# Patient Record
Sex: Female | Born: 1985 | Hispanic: Yes | Marital: Single | Smoking: Never smoker
Health system: Southern US, Community
[De-identification: ages and names within clinical notes are randomized; demographics above are authoritative.]

## PROBLEM LIST (undated history)

## (undated) DIAGNOSIS — L509 Urticaria, unspecified: Secondary | ICD-10-CM

## (undated) DIAGNOSIS — R87629 Unspecified abnormal cytological findings in specimens from vagina: Secondary | ICD-10-CM

## (undated) DIAGNOSIS — Z789 Other specified health status: Secondary | ICD-10-CM

## (undated) HISTORY — PX: NO PAST SURGERIES: SHX2092

## (undated) HISTORY — DX: Unspecified abnormal cytological findings in specimens from vagina: R87.629

## (undated) HISTORY — DX: Other specified health status: Z78.9

---

## 2008-06-08 ENCOUNTER — Inpatient Hospital Stay (HOSPITAL_COMMUNITY): Admission: AD | Admit: 2008-06-08 | Discharge: 2008-06-08 | Payer: Self-pay | Admitting: Obstetrics & Gynecology

## 2008-08-27 ENCOUNTER — Ambulatory Visit: Payer: Self-pay | Admitting: Obstetrics & Gynecology

## 2008-08-27 ENCOUNTER — Inpatient Hospital Stay (HOSPITAL_COMMUNITY): Admission: AD | Admit: 2008-08-27 | Discharge: 2008-08-27 | Payer: Self-pay | Admitting: Obstetrics & Gynecology

## 2008-09-02 ENCOUNTER — Inpatient Hospital Stay (HOSPITAL_COMMUNITY): Admission: AD | Admit: 2008-09-02 | Discharge: 2008-09-07 | Payer: Self-pay | Admitting: Obstetrics

## 2008-09-02 ENCOUNTER — Ambulatory Visit: Payer: Self-pay | Admitting: Obstetrics & Gynecology

## 2009-09-07 ENCOUNTER — Emergency Department (HOSPITAL_COMMUNITY): Admission: EM | Admit: 2009-09-07 | Discharge: 2009-09-07 | Payer: Self-pay | Admitting: Emergency Medicine

## 2009-11-04 ENCOUNTER — Emergency Department (HOSPITAL_COMMUNITY): Admission: EM | Admit: 2009-11-04 | Discharge: 2009-11-04 | Payer: Self-pay | Admitting: Emergency Medicine

## 2010-02-25 ENCOUNTER — Emergency Department (HOSPITAL_COMMUNITY): Admission: EM | Admit: 2010-02-25 | Discharge: 2010-02-25 | Payer: Self-pay | Admitting: Emergency Medicine

## 2010-04-20 ENCOUNTER — Emergency Department (HOSPITAL_COMMUNITY): Admission: EM | Admit: 2010-04-20 | Discharge: 2010-04-20 | Payer: Self-pay | Admitting: Emergency Medicine

## 2010-05-29 ENCOUNTER — Emergency Department (HOSPITAL_COMMUNITY): Admission: EM | Admit: 2010-05-29 | Discharge: 2010-05-30 | Payer: Self-pay | Admitting: Emergency Medicine

## 2010-08-08 ENCOUNTER — Emergency Department (HOSPITAL_COMMUNITY): Admission: EM | Admit: 2010-08-08 | Discharge: 2010-08-09 | Payer: Self-pay | Admitting: Emergency Medicine

## 2010-11-29 ENCOUNTER — Emergency Department (HOSPITAL_COMMUNITY)
Admission: EM | Admit: 2010-11-29 | Discharge: 2010-11-29 | Payer: Self-pay | Source: Home / Self Care | Admitting: Emergency Medicine

## 2010-11-29 LAB — URINALYSIS, ROUTINE W REFLEX MICROSCOPIC
Bilirubin Urine: NEGATIVE
Ketones, ur: 15 mg/dL — AB
Nitrite: NEGATIVE
Protein, ur: NEGATIVE mg/dL
Specific Gravity, Urine: 1.025 (ref 1.005–1.030)
Urine Glucose, Fasting: NEGATIVE mg/dL
Urobilinogen, UA: 1 mg/dL (ref 0.0–1.0)
pH: 6.5 (ref 5.0–8.0)

## 2010-11-29 LAB — WET PREP, GENITAL
Trich, Wet Prep: NONE SEEN
Yeast Wet Prep HPF POC: NONE SEEN

## 2010-11-29 LAB — URINE MICROSCOPIC-ADD ON

## 2010-11-29 LAB — PREGNANCY, URINE: Preg Test, Ur: NEGATIVE

## 2010-12-01 LAB — GC/CHLAMYDIA PROBE AMP, GENITAL
Chlamydia, DNA Probe: NEGATIVE
GC Probe Amp, Genital: NEGATIVE

## 2011-02-08 LAB — WET PREP, GENITAL
Trich, Wet Prep: NONE SEEN
Yeast Wet Prep HPF POC: NONE SEEN

## 2011-02-08 LAB — PREGNANCY, URINE: Preg Test, Ur: NEGATIVE

## 2011-02-08 LAB — URINALYSIS, ROUTINE W REFLEX MICROSCOPIC
Bilirubin Urine: NEGATIVE
Glucose, UA: NEGATIVE mg/dL
Ketones, ur: NEGATIVE mg/dL
Nitrite: NEGATIVE
Protein, ur: NEGATIVE mg/dL
Specific Gravity, Urine: >1.03 — ABNORMAL HIGH (ref 1.005–1.030)
Urobilinogen, UA: 0.2 mg/dL (ref 0.0–1.0)
pH: 6 (ref 5.0–8.0)

## 2011-02-08 LAB — GC/CHLAMYDIA PROBE AMP, GENITAL
Chlamydia, DNA Probe: NEGATIVE
GC Probe Amp, Genital: NEGATIVE

## 2011-02-08 LAB — URINE MICROSCOPIC-ADD ON

## 2011-02-27 LAB — CBC
HCT: 40.2 % (ref 36.0–46.0)
Hemoglobin: 13.7 g/dL (ref 12.0–15.0)
MCHC: 34.1 g/dL (ref 30.0–36.0)
MCV: 90.8 fL (ref 78.0–100.0)
Platelets: 353 10*3/uL (ref 150–400)
RBC: 4.42 MIL/uL (ref 3.87–5.11)
RDW: 12.6 % (ref 11.5–15.5)
WBC: 8.4 10*3/uL (ref 4.0–10.5)

## 2011-02-27 LAB — DIFFERENTIAL
Basophils Absolute: 0.1 10*3/uL (ref 0.0–0.1)
Basophils Relative: 2 % — ABNORMAL HIGH (ref 0–1)
Eosinophils Absolute: 0 10*3/uL (ref 0.0–0.7)
Eosinophils Relative: 0 % (ref 0–5)
Lymphocytes Relative: 24 % (ref 12–46)
Lymphs Abs: 2 10*3/uL (ref 0.7–4.0)
Monocytes Absolute: 0.5 10*3/uL (ref 0.1–1.0)
Monocytes Relative: 6 % (ref 3–12)
Neutro Abs: 5.7 10*3/uL (ref 1.7–7.7)
Neutrophils Relative %: 68 % (ref 43–77)

## 2011-02-27 LAB — BASIC METABOLIC PANEL
BUN: 5 mg/dL — ABNORMAL LOW (ref 6–23)
CO2: 24 mEq/L (ref 19–32)
Calcium: 8.8 mg/dL (ref 8.4–10.5)
Chloride: 104 mEq/L (ref 96–112)
Creatinine, Ser: 0.44 mg/dL (ref 0.4–1.2)
GFR calc Af Amer: >60 mL/min (ref >60)
GFR calc non Af Amer: >60 mL/min (ref >60)
Glucose, Bld: 89 mg/dL (ref 70–99)
Potassium: 3.6 mEq/L (ref 3.5–5.1)
Sodium: 134 mEq/L — ABNORMAL LOW (ref 135–145)

## 2011-02-27 LAB — URINALYSIS, ROUTINE W REFLEX MICROSCOPIC
Bilirubin Urine: NEGATIVE
Glucose, UA: NEGATIVE mg/dL
Hgb urine dipstick: NEGATIVE
Ketones, ur: NEGATIVE mg/dL
Nitrite: NEGATIVE
Protein, ur: NEGATIVE mg/dL
Specific Gravity, Urine: 1.009 (ref 1.005–1.030)
Urobilinogen, UA: 0.2 mg/dL (ref 0.0–1.0)
pH: 8 (ref 5.0–8.0)

## 2011-02-27 LAB — URINE MICROSCOPIC-ADD ON

## 2011-02-28 ENCOUNTER — Emergency Department (HOSPITAL_COMMUNITY)
Admission: EM | Admit: 2011-02-28 | Discharge: 2011-02-28 | Disposition: A | Discharge status: Home / Self Care | Payer: Medicaid Other | Attending: Emergency Medicine | Admitting: Emergency Medicine

## 2011-02-28 DIAGNOSIS — H6500 Acute serous otitis media, unspecified ear: Secondary | ICD-10-CM | POA: Insufficient documentation

## 2011-02-28 DIAGNOSIS — H9209 Otalgia, unspecified ear: Secondary | ICD-10-CM | POA: Insufficient documentation

## 2011-04-10 NOTE — Op Note (Signed)
Sylvia Day, Sylvia Day            ACCOUNT NO.:  [id number]   MEDICAL RECORD NO.:  [id number]           PATIENT TYPE:   LOCATION:                                 FACILITY:   PHYSICIAN:  Allie Bossier, MD        DATE OF BIRTH:  Feb 27, 1986   DATE OF PROCEDURE:  DATE OF DISCHARGE:                               OPERATIVE REPORT   PROCEDURE:  Repair of fourth-degree laceration and vaginal tears.   SURGEON:  Myra C. Marice Potter, MD   ANESTHESIA:  Local plus 2 mg of IV Stadol.   PROCEDURE:  I was called to evaluate with a vaginal laceration after a  normal spontaneous vaginal delivery on this pre-eclamptic patient who is  being treated with mag and induced.  The delivery was apparently  uncomplicated and bleeding from the vagina was already noted before  delivery of the head.  After the baby was delivered, a fourth-degree  tear was noted as well as vaginal sulcus wall tears bilaterally and  labial tears bilaterally.  On exam, the vulva was grossly swollen and  edematous.  There certainly a fourth-degree tear noted.  The rectal  mucosa was involved; however, the entire sphincter was not involved.  There was a 5-cm vaginal sulcus tear on the patient's left and a vaginal  tear from the hymen up through the labia majora bilaterally.  I  initially did a complete inspection, and the patient's bladder was  emptied for 500 mL.  Foley catheter was left in place.  A 3-0 Vicryl  sutures used in this case unless otherwise specified.  I began to repair  the rectal mucosa and brought that out to the peritoneum.  The sutures  were left and tagged and held for later use.  The sphincter was grasped  with Allis clamps and repaired in a four-point interrupted closure.  I  then began with the left vaginal sulcus tear which required the use of a  vaginal retractor.  The cervix was noted to be intact with no  lacerations.  The sulcus tear was repaired in a running locking fashion  with 3-0 Vicryl.  Excellent  hemostasis was noted at the completion of  this.  The vaginal labial tears were then repaired with a subcuticular  running nonlocking 3-0 Vicryl suture.  The remainder of the laceration  then resembled a second-degree midline episiotomy which was repaired in  usual fashion.  At the end of the case, excellent cosmetic results were  obtained.  A rectovaginal exam was done.  No defects were palpable.  She  tolerated the procedure well with approximately 20 mL of 1% lidocaine  and 2 mg of IV Stadol.      Allie Bossier, MD  Electronically Signed     MCD/MEDQ  D:  09/03/2008  T:  09/04/2008  Job:  [id number]

## 2011-04-10 NOTE — Discharge Summary (Signed)
[name], Sylvia Day            ACCOUNT NO.:  [id number]   MEDICAL RECORD NO.:  [id number]          PATIENT TYPE:  INP   LOCATION:  9116                          FACILITY:  [facility]   PHYSICIAN:  Lesly Dukes, M.D. DATE OF BIRTH:  13-Apr-1986   DATE OF ADMISSION:  09/02/2008  DATE OF DISCHARGE:  09/07/2008                               DISCHARGE SUMMARY   REASON FOR ADMISSION:  Induction of labor.   PROCEDURES PRENATAL:  Nonstress test.   PROCEDURES INTRAPARTUM:  Spontaneous vaginal delivery.   PROCEDURES POSTPARTUM:  Antibiotics.   COMPLICATIONS OPERATIVE AND POSTPARTUM:  1. First-degree perineal laceration.  2. Second-degree bilateral sulcus laceration.   DISCHARGE DIAGNOSES:  1. Term pregnancy, delivered.  2. Mild preeclampsia.  3. Anemia.   DISCHARGE INFORMATION:   ACTIVITY:  Pelvic rest x6 weeks.   DIET:  Routine.   MEDICATIONS:  1. Prenatal vitamin p.o. daily.  2. Percocet 5/325 p.o. q.6 h. p.r.n. for pain.  3. Integra F p.o. daily for anemia.  4. Sprintec p.o. daily for birth control.  5. MiraLax 17 g p.o. daily with water for constipation.   STATUS:  Well.   INSTRUCTIONS:  Routine.  Discharge to home.  Follow up in 1 week at  [facility].   LABORATORIES:  On September 06, 2008, hematocrit 14.1 and hemoglobin 4.5.   PRENATAL LABORATORIES:  Blood type O positive, antibody negative,  rubella immune, HIV negative, GBS negative, GC/Chlamydia  negative/negative, and RPR nonreactive.   NEW BORN DATA:  Female, weight 6 pounds 13 ounces, length 20 inches,  home with mother, no complications.      Eino Farber Jerolyn Center, CNM      ______________________________  Lesly Dukes, M.D.    WM/MEDQ  D:  09/07/2008  T:  09/07/2008  Job:  [id number]

## 2011-04-11 ENCOUNTER — Emergency Department (HOSPITAL_COMMUNITY)
Admission: EM | Admit: 2011-04-11 | Discharge: 2011-04-11 | Disposition: A | Discharge status: Home / Self Care | Payer: Medicaid Other | Attending: Emergency Medicine | Admitting: Emergency Medicine

## 2011-04-11 DIAGNOSIS — R112 Nausea with vomiting, unspecified: Secondary | ICD-10-CM | POA: Insufficient documentation

## 2011-04-11 LAB — URINALYSIS, ROUTINE W REFLEX MICROSCOPIC
Bilirubin Urine: NEGATIVE
Glucose, UA: NEGATIVE mg/dL
Ketones, ur: NEGATIVE mg/dL
Nitrite: NEGATIVE
Protein, ur: NEGATIVE mg/dL
Specific Gravity, Urine: 1.025 (ref 1.005–1.030)
Urobilinogen, UA: 0.2 mg/dL (ref 0.0–1.0)
pH: 5.5 (ref 5.0–8.0)

## 2011-04-11 LAB — BASIC METABOLIC PANEL
BUN: 8 mg/dL (ref 6–23)
CO2: 24 mEq/L (ref 19–32)
Calcium: 9.1 mg/dL (ref 8.4–10.5)
Chloride: 108 mEq/L (ref 96–112)
Creatinine, Ser: <0.47 mg/dL (ref 0.4–1.2)
Glucose, Bld: 81 mg/dL (ref 70–99)
Potassium: 3.4 mEq/L — ABNORMAL LOW (ref 3.5–5.1)
Sodium: 140 mEq/L (ref 135–145)

## 2011-04-11 LAB — CBC
HCT: 39.5 % (ref 36.0–46.0)
Hemoglobin: 13.5 g/dL (ref 12.0–15.0)
MCH: 30.3 pg (ref 26.0–34.0)
MCHC: 34.2 g/dL (ref 30.0–36.0)
MCV: 88.8 fL (ref 78.0–100.0)
Platelets: 287 10*3/uL (ref 150–400)
RBC: 4.45 MIL/uL (ref 3.87–5.11)
RDW: 12.5 % (ref 11.5–15.5)
WBC: 5.7 10*3/uL (ref 4.0–10.5)

## 2011-04-11 LAB — DIFFERENTIAL
Basophils Absolute: 0 10*3/uL (ref 0.0–0.1)
Basophils Relative: 0 % (ref 0–1)
Eosinophils Absolute: 0 10*3/uL (ref 0.0–0.7)
Eosinophils Relative: 1 % (ref 0–5)
Lymphocytes Relative: 26 % (ref 12–46)
Lymphs Abs: 1.5 10*3/uL (ref 0.7–4.0)
Monocytes Absolute: 0.4 10*3/uL (ref 0.1–1.0)
Monocytes Relative: 6 % (ref 3–12)
Neutro Abs: 3.8 10*3/uL (ref 1.7–7.7)
Neutrophils Relative %: 67 % (ref 43–77)

## 2011-04-11 LAB — URINE MICROSCOPIC-ADD ON

## 2011-04-11 LAB — PREGNANCY, URINE: Preg Test, Ur: NEGATIVE

## 2011-05-11 ENCOUNTER — Emergency Department (HOSPITAL_COMMUNITY): Payer: Medicaid Other

## 2011-05-11 ENCOUNTER — Emergency Department (HOSPITAL_COMMUNITY)
Admission: EM | Admit: 2011-05-11 | Discharge: 2011-05-11 | Disposition: A | Discharge status: Home / Self Care | Payer: Medicaid Other | Attending: Emergency Medicine | Admitting: Emergency Medicine

## 2011-05-11 DIAGNOSIS — Y998 Other external cause status: Secondary | ICD-10-CM | POA: Insufficient documentation

## 2011-05-11 DIAGNOSIS — S1093XA Contusion of unspecified part of neck, initial encounter: Secondary | ICD-10-CM | POA: Insufficient documentation

## 2011-05-11 DIAGNOSIS — M542 Cervicalgia: Secondary | ICD-10-CM | POA: Insufficient documentation

## 2011-05-11 DIAGNOSIS — S0990XA Unspecified injury of head, initial encounter: Secondary | ICD-10-CM | POA: Insufficient documentation

## 2011-05-11 DIAGNOSIS — S0003XA Contusion of scalp, initial encounter: Secondary | ICD-10-CM | POA: Insufficient documentation

## 2011-05-11 DIAGNOSIS — S139XXA Sprain of joints and ligaments of unspecified parts of neck, initial encounter: Secondary | ICD-10-CM | POA: Insufficient documentation

## 2011-05-14 ENCOUNTER — Emergency Department (HOSPITAL_COMMUNITY)
Admission: EM | Admit: 2011-05-14 | Discharge: 2011-05-14 | Disposition: A | Discharge status: Home / Self Care | Payer: Medicaid Other | Attending: Emergency Medicine | Admitting: Emergency Medicine

## 2011-05-14 DIAGNOSIS — IMO0001 Reserved for inherently not codable concepts without codable children: Secondary | ICD-10-CM | POA: Insufficient documentation

## 2011-05-14 DIAGNOSIS — M25519 Pain in unspecified shoulder: Secondary | ICD-10-CM | POA: Insufficient documentation

## 2011-05-14 DIAGNOSIS — M549 Dorsalgia, unspecified: Secondary | ICD-10-CM | POA: Insufficient documentation

## 2011-05-14 DIAGNOSIS — R110 Nausea: Secondary | ICD-10-CM | POA: Insufficient documentation

## 2011-05-14 DIAGNOSIS — R51 Headache: Secondary | ICD-10-CM | POA: Insufficient documentation

## 2011-05-14 DIAGNOSIS — T148XXA Other injury of unspecified body region, initial encounter: Secondary | ICD-10-CM | POA: Insufficient documentation

## 2011-06-07 ENCOUNTER — Emergency Department (HOSPITAL_COMMUNITY)
Admission: EM | Admit: 2011-06-07 | Discharge: 2011-06-07 | Disposition: A | Discharge status: Home / Self Care | Payer: Medicaid Other | Attending: Emergency Medicine | Admitting: Emergency Medicine

## 2011-06-07 DIAGNOSIS — R51 Headache: Secondary | ICD-10-CM | POA: Insufficient documentation

## 2011-06-07 DIAGNOSIS — F0781 Postconcussional syndrome: Secondary | ICD-10-CM | POA: Insufficient documentation

## 2011-08-23 LAB — URINALYSIS, ROUTINE W REFLEX MICROSCOPIC
Glucose, UA: NEGATIVE
Ketones, ur: NEGATIVE
Protein, ur: NEGATIVE
Urobilinogen, UA: 0.2

## 2011-08-23 LAB — FETAL FIBRONECTIN: Fetal Fibronectin: NEGATIVE

## 2011-08-23 LAB — WET PREP, GENITAL
Trich, Wet Prep: NONE SEEN
Yeast Wet Prep HPF POC: NONE SEEN

## 2011-08-23 LAB — URINE MICROSCOPIC-ADD ON

## 2011-08-28 LAB — CBC
HCT: 12.7 — ABNORMAL LOW
HCT: 13.9 — ABNORMAL LOW
HCT: 14.1 — ABNORMAL LOW
HCT: 30.9 — ABNORMAL LOW
Hemoglobin: 4.1 — CL
Hemoglobin: 4.5 — CL
MCHC: 32
MCHC: 32.3
MCV: 77.4 — ABNORMAL LOW
MCV: 78.5
MCV: 78.7
MCV: 80
Platelets: 215
Platelets: 217
Platelets: 231
Platelets: 293
Platelets: 313
RBC: 1.64 — ABNORMAL LOW
RBC: 1.8 — ABNORMAL LOW
RBC: 1.9 — ABNORMAL LOW
RDW: 16.2 — ABNORMAL HIGH
RDW: 16.6 — ABNORMAL HIGH
RDW: 17.1 — ABNORMAL HIGH
RDW: 17.4 — ABNORMAL HIGH
RDW: 17.6 — ABNORMAL HIGH
RDW: 17.6 — ABNORMAL HIGH
WBC: 22 — ABNORMAL HIGH
WBC: 27.3 — ABNORMAL HIGH

## 2011-08-28 LAB — RPR: RPR Ser Ql: NONREACTIVE

## 2011-08-28 LAB — CROSSMATCH: ABO/RH(D): O POS

## 2011-08-28 LAB — COMPREHENSIVE METABOLIC PANEL
ALT: 13
Albumin: 3 — ABNORMAL LOW
Alkaline Phosphatase: 156 — ABNORMAL HIGH
BUN: 5 — ABNORMAL LOW
CO2: 24
Chloride: 107
Creatinine, Ser: 0.41
GFR calc non Af Amer: >60
Glucose, Bld: 81
Potassium: 4.3
Sodium: 135
Total Bilirubin: 0.3
Total Protein: 5 — ABNORMAL LOW
Total Protein: 6.5

## 2011-08-28 LAB — URIC ACID: Uric Acid, Serum: 3.8

## 2011-08-28 LAB — LACTATE DEHYDROGENASE: LDH: 194

## 2011-08-28 LAB — NO BLOOD PRODUCTS

## 2011-09-12 ENCOUNTER — Emergency Department (HOSPITAL_COMMUNITY): Payer: Medicaid Other

## 2011-09-12 ENCOUNTER — Emergency Department (HOSPITAL_COMMUNITY)
Admission: EM | Admit: 2011-09-12 | Discharge: 2011-09-12 | Disposition: A | Discharge status: Home / Self Care | Payer: Medicaid Other | Attending: Emergency Medicine | Admitting: Emergency Medicine

## 2011-09-12 DIAGNOSIS — R10819 Abdominal tenderness, unspecified site: Secondary | ICD-10-CM | POA: Insufficient documentation

## 2011-09-12 DIAGNOSIS — N898 Other specified noninflammatory disorders of vagina: Secondary | ICD-10-CM | POA: Insufficient documentation

## 2011-09-12 DIAGNOSIS — R109 Unspecified abdominal pain: Secondary | ICD-10-CM | POA: Insufficient documentation

## 2011-09-12 LAB — BASIC METABOLIC PANEL
GFR calc Af Amer: >90 mL/min (ref >90)
GFR calc non Af Amer: >90 mL/min (ref >90)
Glucose, Bld: 84 mg/dL (ref 70–99)
Potassium: 3.9 mEq/L (ref 3.5–5.1)
Sodium: 135 mEq/L (ref 135–145)

## 2011-09-12 LAB — URINALYSIS, ROUTINE W REFLEX MICROSCOPIC
Bilirubin Urine: NEGATIVE
Glucose, UA: NEGATIVE mg/dL
Nitrite: NEGATIVE
Specific Gravity, Urine: 1.03 (ref 1.005–1.030)
pH: 6 (ref 5.0–8.0)

## 2011-09-12 LAB — DIFFERENTIAL
Eosinophils Absolute: 0 10*3/uL (ref 0.0–0.7)
Lymphs Abs: 1.6 10*3/uL (ref 0.7–4.0)
Neutro Abs: 4.8 10*3/uL (ref 1.7–7.7)
Neutrophils Relative %: 68 % (ref 43–77)

## 2011-09-12 LAB — CBC
MCH: 30.6 pg (ref 26.0–34.0)
Platelets: 309 10*3/uL (ref 150–400)
RBC: 4.51 MIL/uL (ref 3.87–5.11)
WBC: 7 10*3/uL (ref 4.0–10.5)

## 2011-09-12 LAB — WET PREP, GENITAL: Yeast Wet Prep HPF POC: NONE SEEN

## 2011-09-12 LAB — URINE MICROSCOPIC-ADD ON

## 2011-09-12 MED ORDER — KETOROLAC TROMETHAMINE 60 MG/2ML IM SOLN
60.0000 mg | Freq: Once | INTRAMUSCULAR | Status: AC
  Administered 2011-09-12: 60 mg via INTRAMUSCULAR
  Filled 2011-09-12: qty 2

## 2011-09-12 MED ORDER — TRAMADOL HCL 50 MG PO TABS: 50.0000 mg | ORAL_TABLET | Freq: Four times a day (QID) | ORAL | Status: AC | PRN

## 2011-09-12 NOTE — ED Notes (Signed)
Pt c/o pain in lower abd since Sunday.  Denies any vaginal discharge or abnormal bleeding.  Says recently stopped birth control so periods are irregular.  LMP was last month.  Denies any N/V/D.  Denies any burning with urination.  Says feels like abd is swollen.

## 2011-09-12 NOTE — ED Provider Notes (Signed)
History   This chart was scribed for Sylvia Stairs, MD by Clarita Crane. The patient was seen in room APA02/APA02 and the patient's care was started at 11:54AM.   CSN: [id number] Arrival date & time: 09/12/2011 10:51 AM   First MD Initiated Contact with Patient 09/12/11 1142      Chief Complaint  Patient presents with  . Abdominal Pain    (Consider location/radiation/quality/duration/timing/severity/associated sxs/prior treatment) HPI Sylvia Day is a 25 y.o. female who presents to the Emergency Department complaining of constant non-radiating moderate to severe lower abdominal pain with associated vaginal swelling. Denies n/v, fever, vaginal discharge, vaginal bleeding, lightheadedness, dizziness, dysuria. Patient reports having a 4th degree vaginal tear while delivering her child several years ago and since that time, patient notes that her vaginal lips will swell occasionally during menstrual periods or following intercourse.   History reviewed. No pertinent past medical history.  History reviewed. No pertinent past surgical history.  No family history on file.  History  Substance Use Topics  . Smoking status: Never Smoker   . Smokeless tobacco: Not on file  . Alcohol Use: No    OB History    Grav Para Term Preterm Abortions TAB SAB Ect Mult Living                  Review of Systems 10 Systems reviewed and are negative for acute change except as noted in the HPI.  Allergies  Latex  Home Medications  No current outpatient prescriptions on file.  BP 101/63  Pulse 71  Temp(Src) 98.3 F (36.8 C) (Oral)  Resp 16  Ht 4\' 11"  (1.499 m)  Wt 105 lb (47.628 kg)  BMI 21.21 kg/m2  SpO2 100%  LMP 08/02/2011  Physical Exam  Nursing note and vitals reviewed. Constitutional: She is oriented to person, place, and time. She appears well-developed and well-nourished. No distress.  HENT:  Head: Normocephalic and atraumatic.  Eyes: EOM are normal.  Neck: Neck  supple. No tracheal deviation present.  Cardiovascular: Normal rate, regular rhythm and normal heart sounds.  Exam reveals no friction rub.   No murmur heard. Pulmonary/Chest: Effort normal and breath sounds normal. No respiratory distress. She has no wheezes. She has no rales.  Abdominal: She exhibits no distension. There is tenderness (minimal) in the suprapubic area.  Genitourinary: Vaginal discharge found.       Minimal vaginal discharge.  Os closed.  No vaginal bleeding.  No rashes.  Mild suprapubic and right adnexal tenderness  Musculoskeletal: Normal range of motion. She exhibits no edema.  Neurological: She is alert and oriented to person, place, and time. No sensory deficit.  Skin: Skin is warm and dry.  Psychiatric: She has a normal mood and affect. Her behavior is normal.    ED Course  Procedures (including critical care time)  DIAGNOSTIC STUDIES: Oxygen Saturation is 100% on room air, normal by my interpretation.    COORDINATION OF CARE: 12:00PM- Patient consents to having pelvic exam performed.    Labs Reviewed  URINALYSIS, ROUTINE W REFLEX MICROSCOPIC - Abnormal; Notable for the following:    Hgb urine dipstick SMALL (*)    All other components within normal limits  URINE MICROSCOPIC-ADD ON - Abnormal; Notable for the following:    Bacteria, UA FEW (*)    All other components within normal limits  WET PREP, GENITAL - Abnormal; Notable for the following:    WBC, Wet Prep HPF POC FEW (*)    All other components within normal  limits  POCT PREGNANCY, URINE  CBC  DIFFERENTIAL  BASIC METABOLIC PANEL  GC/CHLAMYDIA PROBE AMP, GENITAL   No results found.   No diagnosis found.    MDM   Abdominal pain Lower abdominal tenderness, with no peritoneal signs.  No evidence of urinary tract infection.  Minimal vaginal discharge, but positive adnexal tenderness.      I personally performed the services described in this documentation, which was scribed in my  presence. The recorded information has been reviewed and considered.    Sylvia Stairs, MD 09/12/11 1526

## 2011-09-12 NOTE — ED Notes (Signed)
I went in to start pt's IV and draw blood and pt refusing; I stated we could wait til EDP comes in to see pt and see if any orders are placed for blood or IV; pt also stated she needed a note to show that she was seen in the ED because she is suppose to be in court this am; pt states she has a fax number that can be used so the papers could be faxed to the correct person

## 2011-09-13 LAB — GC/CHLAMYDIA PROBE AMP, GENITAL: Chlamydia, DNA Probe: NEGATIVE

## 2011-12-25 ENCOUNTER — Other Ambulatory Visit: Payer: Self-pay | Admitting: Nurse Practitioner

## 2011-12-25 ENCOUNTER — Other Ambulatory Visit (HOSPITAL_COMMUNITY)
Admission: RE | Admit: 2011-12-25 | Discharge: 2011-12-25 | Disposition: A | Discharge status: Home / Self Care | Payer: Medicaid Other | Source: Ambulatory Visit | Attending: Unknown Physician Specialty | Admitting: Unknown Physician Specialty

## 2011-12-25 DIAGNOSIS — N870 Mild cervical dysplasia: Secondary | ICD-10-CM | POA: Insufficient documentation

## 2011-12-25 DIAGNOSIS — R87610 Atypical squamous cells of undetermined significance on cytologic smear of cervix (ASC-US): Secondary | ICD-10-CM | POA: Insufficient documentation

## 2012-11-17 ENCOUNTER — Encounter (HOSPITAL_COMMUNITY): Payer: Self-pay | Admitting: *Deleted

## 2012-11-17 ENCOUNTER — Emergency Department (HOSPITAL_COMMUNITY)
Admission: EM | Admit: 2012-11-17 | Discharge: 2012-11-17 | Disposition: A | Discharge status: Home / Self Care | Payer: Medicaid Other | Attending: Emergency Medicine | Admitting: Emergency Medicine

## 2012-11-17 DIAGNOSIS — M545 Low back pain, unspecified: Secondary | ICD-10-CM | POA: Insufficient documentation

## 2012-11-17 DIAGNOSIS — Z79899 Other long term (current) drug therapy: Secondary | ICD-10-CM | POA: Insufficient documentation

## 2012-11-17 DIAGNOSIS — R109 Unspecified abdominal pain: Secondary | ICD-10-CM | POA: Insufficient documentation

## 2012-11-17 DIAGNOSIS — Z3202 Encounter for pregnancy test, result negative: Secondary | ICD-10-CM | POA: Insufficient documentation

## 2012-11-17 DIAGNOSIS — N739 Female pelvic inflammatory disease, unspecified: Secondary | ICD-10-CM | POA: Insufficient documentation

## 2012-11-17 LAB — WET PREP, GENITAL
Trich, Wet Prep: NONE SEEN
Yeast Wet Prep HPF POC: NONE SEEN

## 2012-11-17 MED ORDER — CEFTRIAXONE SODIUM 250 MG IJ SOLR
250.0000 mg | Freq: Once | INTRAMUSCULAR | Status: AC
  Administered 2012-11-17: 250 mg via INTRAMUSCULAR
  Filled 2012-11-17: qty 250

## 2012-11-17 MED ORDER — METRONIDAZOLE 500 MG PO TABS
500.0000 mg | ORAL_TABLET | Freq: Once | ORAL | Status: AC
  Administered 2012-11-17: 500 mg via ORAL
  Filled 2012-11-17: qty 1

## 2012-11-17 MED ORDER — OXYCODONE-ACETAMINOPHEN 5-325 MG PO TABS
2.0000 | ORAL_TABLET | Freq: Once | ORAL | Status: AC
  Administered 2012-11-17: 2 via ORAL
  Filled 2012-11-17: qty 2

## 2012-11-17 MED ORDER — IBUPROFEN 600 MG PO TABS: 600.0000 mg | ORAL_TABLET | Freq: Three times a day (TID) | ORAL | Status: DC | PRN

## 2012-11-17 MED ORDER — METRONIDAZOLE 500 MG PO TABS: 500.0000 mg | ORAL_TABLET | Freq: Two times a day (BID) | ORAL | Status: DC

## 2012-11-17 MED ORDER — DOXYCYCLINE HYCLATE 100 MG PO TABS
100.0000 mg | ORAL_TABLET | Freq: Once | ORAL | Status: AC
  Administered 2012-11-17: 100 mg via ORAL
  Filled 2012-11-17: qty 1

## 2012-11-17 MED ORDER — DOXYCYCLINE HYCLATE 100 MG PO CAPS: 100.0000 mg | ORAL_CAPSULE | Freq: Two times a day (BID) | ORAL | Status: DC

## 2012-11-17 MED ORDER — ONDANSETRON 8 MG PO TBDP
8.0000 mg | ORAL_TABLET | Freq: Once | ORAL | Status: AC
  Administered 2012-11-17: 8 mg via ORAL
  Filled 2012-11-17: qty 1

## 2012-11-17 MED ORDER — HYDROCODONE-ACETAMINOPHEN 5-325 MG PO TABS: 1.0000 | ORAL_TABLET | ORAL | Status: DC | PRN

## 2012-11-17 NOTE — ED Notes (Signed)
Pt states she was treated for bacterial vaginosis with an antibiotic. Pt states after that she has had vaginal itching, lower abdominal and lower back pain.

## 2012-11-17 NOTE — ED Provider Notes (Signed)
History     CSN: [id number]  Arrival date & time 11/17/12  0054   First MD Initiated Contact with Patient 11/17/12 0127      Chief Complaint  Patient presents with  . Vaginal Itching  . Abdominal Pain  . Back Pain    (Consider location/radiation/quality/duration/timing/severity/associated sxs/prior treatment) The history is provided by the patient.   patient reports having colposcopy 2 weeks ago and now she is having new lower abdominal lower back pain.  She's also had vaginal itching and some vaginal discharge.  No fevers or chills.  No nausea vomiting or diarrhea.  Nothing worsens her symptoms.  Nothing improves her symptoms.  Symptoms are constant and mild to moderate in severity.  She has not followed back up with her GYN who performed a colposcopy.  No vaginal bleeding.  No urinary symptoms.  History reviewed. No pertinent past medical history.  History reviewed. No pertinent past surgical history.  History reviewed. No pertinent family history.  History  Substance Use Topics  . Smoking status: Never Smoker   . Smokeless tobacco: Not on file  . Alcohol Use: No    OB History    Grav Para Term Preterm Abortions TAB SAB Ect Mult Living                  Review of Systems  All other systems reviewed and are negative.    Allergies  Latex  Home Medications   Current Outpatient Rx  Name  Route  Sig  Dispense  Refill  . DOXYCYCLINE HYCLATE 100 MG PO CAPS   Oral   Take 1 capsule (100 mg total) by mouth 2 (two) times daily.   20 capsule   0   . HYDROCODONE-ACETAMINOPHEN 5-325 MG PO TABS   Oral   Take 1 tablet by mouth every 4 (four) hours as needed for pain.   15 tablet   0   . IBUPROFEN 600 MG PO TABS   Oral   Take 1 tablet (600 mg total) by mouth every 8 (eight) hours as needed for pain.   15 tablet   0   . METRONIDAZOLE 500 MG PO TABS   Oral   Take 1 tablet (500 mg total) by mouth 2 (two) times daily.   20 tablet   0     BP 124/71  Pulse 78   Temp 97.5 F (36.4 C)  Resp 20  Ht 4\' 11"  (1.499 m)  Wt 98 lb (44.453 kg)  BMI 19.79 kg/m2  SpO2 100%  LMP 10/22/2012  Physical Exam  Nursing note and vitals reviewed. Constitutional: She is oriented to person, place, and time. She appears well-developed and well-nourished. No distress.  HENT:  Head: Normocephalic and atraumatic.  Eyes: EOM are normal.  Neck: Normal range of motion.  Cardiovascular: Normal rate, regular rhythm and normal heart sounds.   Pulmonary/Chest: Effort normal and breath sounds normal.  Abdominal: Soft. She exhibits no distension. There is no tenderness.  Genitourinary:       Normal external genitalia.  Patient with friable-appearing cervix that began to slightly bleeding after speculum was passed by it.  No significant cervical discharge but the patient does have cervical motion tenderness.  No adnexal masses or fullness.  Musculoskeletal: Normal range of motion.  Neurological: She is alert and oriented to person, place, and time.  Skin: Skin is warm and dry.  Psychiatric: She has a normal mood and affect. Judgment normal.    ED Course  Procedures (including  critical care time)  Labs Reviewed  WET PREP, GENITAL - Abnormal; Notable for the following:    Clue Cells Wet Prep HPF POC FEW (*)     WBC, Wet Prep HPF POC FEW (*)     All other components within normal limits  PREGNANCY, URINE  GC/CHLAMYDIA PROBE AMP   No results found.   1. Pelvic inflammatory disease (PID)       MDM  The patient be treated as pelvic inflammatory disease and sent home with a prescription for some pain medicine.  I've asked that the patient followup back at the [facility] for additional evaluation of her cervix.  I told her that her cervix looks friable and given that she is 2 weeks post colposcopy I think they need to look at her cervix again and I've asked that when she follows up that she specifically asked that they visualize her cervix.  She may need repeat  colposcopy as her cervix appears abnormal.        Lyanne Co, MD 11/17/12 [phone]

## 2012-11-17 NOTE — ED Notes (Signed)
Pt discharged. Pt stable at time of discharge. Medications reviewed pt has no questions regarding discharge at this time. Pt voiced understanding of discharge instructions.  

## 2013-12-22 ENCOUNTER — Other Ambulatory Visit (HOSPITAL_COMMUNITY)
Admission: RE | Admit: 2013-12-22 | Discharge: 2013-12-22 | Disposition: A | Discharge status: Home / Self Care | Payer: Medicaid Other | Source: Ambulatory Visit | Attending: Unknown Physician Specialty | Admitting: Unknown Physician Specialty

## 2013-12-22 DIAGNOSIS — R87612 Low grade squamous intraepithelial lesion on cytologic smear of cervix (LGSIL): Secondary | ICD-10-CM | POA: Insufficient documentation

## 2013-12-22 DIAGNOSIS — N870 Mild cervical dysplasia: Secondary | ICD-10-CM | POA: Insufficient documentation

## 2013-12-22 DIAGNOSIS — N72 Inflammatory disease of cervix uteri: Secondary | ICD-10-CM | POA: Insufficient documentation

## 2014-06-28 ENCOUNTER — Encounter (HOSPITAL_COMMUNITY): Payer: Self-pay | Admitting: Emergency Medicine

## 2014-06-28 ENCOUNTER — Emergency Department (HOSPITAL_COMMUNITY)
Admission: EM | Admit: 2014-06-28 | Discharge: 2014-06-28 | Disposition: A | Discharge status: Home / Self Care | Payer: MEDICAID | Attending: Emergency Medicine | Admitting: Emergency Medicine

## 2014-06-28 DIAGNOSIS — G44209 Tension-type headache, unspecified, not intractable: Secondary | ICD-10-CM

## 2014-06-28 DIAGNOSIS — R51 Headache: Secondary | ICD-10-CM | POA: Insufficient documentation

## 2014-06-28 DIAGNOSIS — Z91040 Latex allergy status: Secondary | ICD-10-CM | POA: Insufficient documentation

## 2014-06-28 MED ORDER — HYDROCODONE-ACETAMINOPHEN 5-325 MG PO TABS: 2.0000 | ORAL_TABLET | ORAL | Status: DC | PRN

## 2014-06-28 MED ORDER — ONDANSETRON 4 MG PO TBDP
4.0000 mg | ORAL_TABLET | Freq: Once | ORAL | Status: AC
  Administered 2014-06-28: 4 mg via ORAL
  Filled 2014-06-28: qty 1

## 2014-06-28 MED ORDER — CYCLOBENZAPRINE HCL 10 MG PO TABS
5.0000 mg | ORAL_TABLET | Freq: Once | ORAL | Status: AC
  Administered 2014-06-28: 5 mg via ORAL
  Filled 2014-06-28: qty 1

## 2014-06-28 MED ORDER — METHOCARBAMOL 500 MG PO TABS: 500.0000 mg | ORAL_TABLET | Freq: Two times a day (BID) | ORAL | Status: DC

## 2014-06-28 MED ORDER — HYDROCODONE-ACETAMINOPHEN 5-325 MG PO TABS
1.0000 | ORAL_TABLET | Freq: Once | ORAL | Status: AC
  Administered 2014-06-28: 1 via ORAL
  Filled 2014-06-28: qty 1

## 2014-06-28 NOTE — ED Provider Notes (Signed)
CSN: [id number]     Arrival date & time 06/28/14  1656 History  This chart was scribed for Rolland PorterMark Juline Sanderford, MD by Milly JakobJohn Lee Graves, ED Scribe. The patient was seen in room APA16A/APA16A. Patient's care was started at 6:11 PM.   Chief Complaint  Patient presents with  . Headache   The history is provided by the patient. No language interpreter was used.   HPI Comments: Sylvia Day is a 28 y.o. female who presents to the Emergency Department complaining of a severe, front, bilateral, headache which began 7 days ago after returning from her trip to [place], and became acutely worsened today. She reports that it feels better during the day than at night, as well as associated nausea and light sensitivity. She reports intermittent ringing in her right hear that began 3 days ago. She reports taking Benadryl with minimal relief and Ibuprofen with minimal relief. She denies difficulty swallowing, slurred speech, or abnormal stress.   History reviewed. No pertinent past medical history. History reviewed. No pertinent past surgical history. Family History  Problem Relation Age of Onset  . Stroke Father    History  Substance Use Topics  . Smoking status: Never Smoker   . Smokeless tobacco: Never Used  . Alcohol Use: No   OB History   Grav Para Term Preterm Abortions TAB SAB Ect Mult Living   1 1 1       1      Review of Systems  Constitutional: Negative for fever, chills, diaphoresis, appetite change and fatigue.  HENT: Negative for mouth sores, sore throat and trouble swallowing.   Eyes: Negative for visual disturbance.  Respiratory: Negative for cough, chest tightness, shortness of breath and wheezing.   Cardiovascular: Negative for chest pain.  Gastrointestinal: Positive for nausea. Negative for abdominal pain, diarrhea and abdominal distention.  Endocrine: Negative for polydipsia, polyphagia and polyuria.  Genitourinary: Negative for dysuria, frequency and hematuria.  Musculoskeletal:  Negative for gait problem.  Skin: Negative for color change, pallor and rash.  Neurological: Positive for headaches. Negative for dizziness, syncope and light-headedness.  Hematological: Does not bruise/bleed easily.  Psychiatric/Behavioral: Negative for behavioral problems and confusion.    Allergies  Latex  Home Medications   Prior to Admission medications   Medication Sig Start Date End Date Taking? Authorizing Provider  HYDROcodone-acetaminophen (NORCO/VICODIN) 5-325 MG per tablet Take 2 tablets by mouth every 4 (four) hours as needed. 06/28/14   Rolland PorterMark Jencarlos Nicolson, MD  methocarbamol (ROBAXIN) 500 MG tablet Take 1 tablet (500 mg total) by mouth 2 (two) times daily. 06/28/14   Rolland PorterMark Jahzier Villalon, MD   Triage Vitals: BP 115/77  Pulse 112  Temp(Src) 98.1 F (36.7 C) (Oral)  Resp 20  Ht 4\' 11"  (1.499 m)  Wt 98 lb (44.453 kg)  BMI 19.78 kg/m2  SpO2 98%  LMP 05/22/2014 Physical Exam  Constitutional: She is oriented to person, place, and time. She appears well-developed and well-nourished. No distress.  HENT:  Head: Normocephalic.  Bifrontal tenderness. Bitemporal tenderness.   Eyes: Conjunctivae are normal. Pupils are equal, round, and reactive to light. No scleral icterus.  Neck: Normal range of motion. Neck supple. No thyromegaly present.  Cardiovascular: Normal rate and regular rhythm.  Exam reveals no gallop and no friction rub.   No murmur heard. Pulmonary/Chest: Effort normal and breath sounds normal. No respiratory distress. She has no wheezes. She has no rales.  Abdominal: Soft. Bowel sounds are normal. She exhibits no distension. There is no tenderness. There is no rebound.  Musculoskeletal: Normal range of motion.  Neurological: She is alert and oriented to person, place, and time.  Skin: Skin is warm and dry. No rash noted.  Psychiatric: She has a normal mood and affect. Her behavior is normal.    ED Course  Procedures (including critical care time) DIAGNOSTIC STUDIES: Oxygen  Saturation is 98% on room air, normal by my interpretation.    COORDINATION OF CARE: 6:16 PM-Discussed treatment plan which includes Flexeril with pt at bedside and pt agreed to plan.   Labs Review Labs Reviewed - No data to display  Imaging Review No results found.   EKG Interpretation None      MDM   Final diagnoses:  Tension headache     I personally performed the services described in this documentation, which was scribed in my presence. The recorded information has been reviewed and is accurate.     Rolland Porter, MD 06/29/14 [phone]

## 2014-06-28 NOTE — Discharge Instructions (Signed)
Tension Headache °A tension headache is pain, pressure, or aching felt over the front and sides of the head. Tension headaches often come after stress, feeling worried (anxiety), or feeling sad or down for a while (depressed). °HOME CARE °· Only take medicine as told by your doctor. °· Lie down in a dark, quiet room when you have a headache. °· Keep a journal to find out if certain things bring on headaches. For example, write down: °¨ What you eat and drink. °¨ How much sleep you get. °¨ Any change to your diet or medicines. °· Relax by getting a massage or doing other relaxing activities. °· Put ice or heat packs on the head and neck area as told by your doctor. °· Lessen stress. °· Sit up straight. Do not tighten (tense) your muscles. °· Quit smoking if you smoke. °· Lessen how much alcohol you drink. °· Lessen how much caffeine you drink, or stop drinking caffeine. °· Eat and exercise regularly. °· Get enough sleep. °· Avoid using too much pain medicine. °GET HELP RIGHT AWAY IF:  °· Your headache becomes really bad. °· You have a fever. °· You have a stiff neck. °· You have trouble seeing. °· Your muscles are weak, or you lose muscle control. °· You lose your balance or have trouble walking. °· You feel like you will pass out (faint), or you pass out. °· You have really bad symptoms that are different than your first symptoms. °· You have problems with the medicines given to you by your doctor. °· Your medicines do not work. °· Your headache feels different than the other headaches. °· You feel sick to your stomach (nauseous) or throw up (vomit). °MAKE SURE YOU:  °· Understand these instructions. °· Will watch your condition. °· Will get help right away if you are not doing well or get worse. °Document Released: 02/06/2010 Document Revised: 02/04/2012 Document Reviewed: 11/02/2011 °[employer]® Patient Information ©2015 [employer]. This information is not intended to replace advice given to you by your health  care provider. Make sure you discuss any questions you have with your health care provider. ° °

## 2014-06-28 NOTE — ED Notes (Addendum)
Patient c/o headache with severe nausea x1. Per patient headache progressively getting. Per patient traveled on airplane to [place] on 7/24 and headache started then. Denies any vomiting, dirrahea, abd pain, fevers, or blurred vision. Patient does report "ringing" in both ears. Patient reports taking benadryl and ibuprofen with no relief.

## 2014-06-28 NOTE — ED Notes (Signed)
MD at bedside. 

## 2014-09-23 ENCOUNTER — Emergency Department (HOSPITAL_COMMUNITY)
Admission: EM | Admit: 2014-09-23 | Discharge: 2014-09-24 | Disposition: A | Discharge status: Home / Self Care | Payer: Medicaid Other | Attending: Emergency Medicine | Admitting: Emergency Medicine

## 2014-09-23 ENCOUNTER — Encounter (HOSPITAL_COMMUNITY): Payer: Self-pay | Admitting: Emergency Medicine

## 2014-09-23 DIAGNOSIS — Z79899 Other long term (current) drug therapy: Secondary | ICD-10-CM | POA: Diagnosis not present

## 2014-09-23 DIAGNOSIS — Z91040 Latex allergy status: Secondary | ICD-10-CM | POA: Insufficient documentation

## 2014-09-23 DIAGNOSIS — R109 Unspecified abdominal pain: Secondary | ICD-10-CM

## 2014-09-23 DIAGNOSIS — K922 Gastrointestinal hemorrhage, unspecified: Secondary | ICD-10-CM | POA: Insufficient documentation

## 2014-09-23 DIAGNOSIS — K921 Melena: Secondary | ICD-10-CM | POA: Diagnosis present

## 2014-09-23 DIAGNOSIS — Z3202 Encounter for pregnancy test, result negative: Secondary | ICD-10-CM | POA: Diagnosis not present

## 2014-09-23 LAB — CBC WITH DIFFERENTIAL/PLATELET
BASOS PCT: 0 % (ref 0–1)
Basophils Absolute: 0 10*3/uL (ref 0.0–0.1)
EOS ABS: 0.1 10*3/uL (ref 0.0–0.7)
Eosinophils Relative: 1 % (ref 0–5)
HCT: 37 % (ref 36.0–46.0)
Hemoglobin: 12.8 g/dL (ref 12.0–15.0)
LYMPHS ABS: 3.2 10*3/uL (ref 0.7–4.0)
Lymphocytes Relative: 35 % (ref 12–46)
MCH: 30.3 pg (ref 26.0–34.0)
MCHC: 34.6 g/dL (ref 30.0–36.0)
MCV: 87.5 fL (ref 78.0–100.0)
Monocytes Absolute: 0.8 10*3/uL (ref 0.1–1.0)
Monocytes Relative: 8 % (ref 3–12)
NEUTROS PCT: 56 % (ref 43–77)
Neutro Abs: 5.1 10*3/uL (ref 1.7–7.7)
PLATELETS: 307 10*3/uL (ref 150–400)
RBC: 4.23 MIL/uL (ref 3.87–5.11)
RDW: 12.6 % (ref 11.5–15.5)
WBC: 9.2 10*3/uL (ref 4.0–10.5)

## 2014-09-23 LAB — BASIC METABOLIC PANEL
Anion gap: 10 (ref 5–15)
BUN: 10 mg/dL (ref 6–23)
CHLORIDE: 106 meq/L (ref 96–112)
CO2: 25 mEq/L (ref 19–32)
Calcium: 9.2 mg/dL (ref 8.4–10.5)
Creatinine, Ser: 0.51 mg/dL (ref 0.50–1.10)
GLUCOSE: 109 mg/dL — AB (ref 70–99)
POTASSIUM: 4 meq/L (ref 3.7–5.3)
SODIUM: 141 meq/L (ref 137–147)

## 2014-09-23 NOTE — ED Notes (Signed)
Patient states she noticed right red blood in her stools that started on Tuesday. Patient states cramping/urge to have bowel movement that started yesterday. Patient denies weakness or fatigue. A&OX4

## 2014-09-23 NOTE — ED Notes (Signed)
Rectal bleeding x 3 days, Having several stools a day with blood in stools each time.  Alert,  NAD,  Abd cramping at times. No vomiting

## 2014-09-24 ENCOUNTER — Emergency Department (HOSPITAL_COMMUNITY): Payer: Medicaid Other

## 2014-09-24 LAB — URINALYSIS, ROUTINE W REFLEX MICROSCOPIC
Bilirubin Urine: NEGATIVE
Glucose, UA: NEGATIVE mg/dL
Ketones, ur: NEGATIVE mg/dL
LEUKOCYTES UA: NEGATIVE
Nitrite: NEGATIVE
Protein, ur: NEGATIVE mg/dL
Specific Gravity, Urine: 1.02 (ref 1.005–1.030)
UROBILINOGEN UA: 0.2 mg/dL (ref 0.0–1.0)
pH: 7 (ref 5.0–8.0)

## 2014-09-24 LAB — URINE MICROSCOPIC-ADD ON

## 2014-09-24 LAB — POC OCCULT BLOOD, ED: Fecal Occult Bld: POSITIVE — AB

## 2014-09-24 LAB — PREGNANCY, URINE: PREG TEST UR: NEGATIVE

## 2014-09-24 MED ORDER — DICYCLOMINE HCL 20 MG PO TABS: 20.0000 mg | ORAL_TABLET | Freq: Two times a day (BID) | ORAL | Status: DC | PRN

## 2014-09-24 MED ORDER — DOCUSATE SODIUM 100 MG PO CAPS: 100.0000 mg | ORAL_CAPSULE | Freq: Two times a day (BID) | ORAL | Status: DC

## 2014-09-24 NOTE — ED Provider Notes (Signed)
CSN: [id number]     Arrival date & time 09/23/14  2211 History  This chart was scribed for Loren [id number] Alieu Finnigan, MD by Richarda Overlieichard Holland, ED Scribe. This patient was seen in room APA14/APA14 and the patient's care was started 12:20 AM.    No chief complaint on file.  HPI HPI Comments: Sylvia Day is a 28 y.o. female who presents to the Emergency Department complaining of blood in her stool that started 3 days ago. Pt reports she has blood in all of her stools and reports the blood is bright red. Pt reports that she has pain during her BMs. She reports an associated change in appetite, abdominal pain and nausea. She reports intermittent abdominal cramping. She denies fevers and chills. Pt reports no recent international travel.   PCP GOLDING    History reviewed. No pertinent past medical history. History reviewed. No pertinent past surgical history. Family History  Problem Relation Age of Onset  . Stroke Father    History  Substance Use Topics  . Smoking status: Never Smoker   . Smokeless tobacco: Never Used  . Alcohol Use: No   OB History   Grav Para Term Preterm Abortions TAB SAB Ect Mult Living   1 1 1       1      Review of Systems  Constitutional: Negative for fever and chills.  Gastrointestinal: Positive for abdominal pain and blood in stool. Negative for nausea, vomiting, diarrhea and constipation.  Genitourinary: Negative for dysuria, frequency, hematuria, flank pain, vaginal bleeding, vaginal discharge and pelvic pain.  Musculoskeletal: Negative for back pain, neck pain and neck stiffness.  Skin: Negative for rash and wound.  Neurological: Negative for dizziness, syncope, weakness, light-headedness, numbness and headaches.  All other systems reviewed and are negative.     Allergies  Latex  Home Medications   Prior to Admission medications   Medication Sig Start Date End Date Taking? Authorizing Provider  HYDROcodone-acetaminophen (NORCO/VICODIN) 5-325 MG per  tablet Take 2 tablets by mouth every 4 (four) hours as needed. 06/28/14   Rolland PorterMark James, MD  methocarbamol (ROBAXIN) 500 MG tablet Take 1 tablet (500 mg total) by mouth 2 (two) times daily. 06/28/14   Rolland PorterMark James, MD   BP 139/73  Pulse 63  Temp(Src) 98 F (36.7 C) (Oral)  Resp 17  Ht 4\' 11"  (1.499 m)  Wt 98 lb (44.453 kg)  BMI 19.78 kg/m2  SpO2 100%  LMP 08/15/2014 Physical Exam  Nursing note and vitals reviewed. Constitutional: She is oriented to person, place, and time. She appears well-developed and well-nourished. No distress.  HENT:  Head: Normocephalic and atraumatic.  Mouth/Throat: Oropharynx is clear and moist.  Eyes: EOM are normal. Pupils are equal, round, and reactive to light.  Neck: Normal range of motion. Neck supple.  Cardiovascular: Normal rate and regular rhythm.   Pulmonary/Chest: Effort normal and breath sounds normal. No respiratory distress. She has no wheezes. She has no rales. She exhibits no tenderness.  Abdominal: Soft. Bowel sounds are normal. She exhibits no distension and no mass. There is no tenderness. There is no rebound and no guarding.  Genitourinary: Guaiac positive stool.  No external/internal hemorrhoids. No gross blood on exam. Mild tenderness. No obvious fissures.  Musculoskeletal: Normal range of motion. She exhibits no edema and no tenderness.  Neurological: She is alert and oriented to person, place, and time.  Skin: Skin is warm and dry. No rash noted. No erythema.  Psychiatric: She has a normal mood and affect. Her behavior  is normal.    ED Course  Procedures (including critical care time) Labs Review Labs Reviewed  BASIC METABOLIC PANEL - Abnormal; Notable for the following:    Glucose, Bld 109 (*)    All other components within normal limits  CBC WITH DIFFERENTIAL    Imaging Review No results found.   EKG Interpretation None      MDM   Final diagnoses:  None    I personally performed the services described in this  documentation, which was scribed in my presence. The recorded information has been reviewed and is accurate.  Patient is hemodynamically stable. Hemoglobin is stable. She is advised follow-up with gastroenterology. She's been given return precautions and has voiced understanding.    Loren Raceravid Takeysha Bonk, MD 09/24/14 [phone]

## 2014-09-24 NOTE — Discharge Instructions (Signed)
Abdominal Pain, Women °Abdominal (stomach, pelvic, or belly) pain can be caused by many things. It is important to tell your doctor: °· The location of the pain. °· Does it come and go or is it present all the time? °· Are there things that start the pain (eating certain foods, exercise)? °· Are there other symptoms associated with the pain (fever, nausea, vomiting, diarrhea)? °All of this is helpful to know when trying to find the cause of the pain. °CAUSES  °· Stomach: virus or bacteria infection, or ulcer. °· Intestine: appendicitis (inflamed appendix), regional ileitis (Crohn's disease), ulcerative colitis (inflamed colon), irritable bowel syndrome, diverticulitis (inflamed diverticulum of the colon), or cancer of the stomach or intestine. °· Gallbladder disease or stones in the gallbladder. °· Kidney disease, kidney stones, or infection. °· Pancreas infection or cancer. °· Fibromyalgia (pain disorder). °· Diseases of the female organs: °¨ Uterus: fibroid (non-cancerous) tumors or infection. °¨ Fallopian tubes: infection or tubal pregnancy. °¨ Ovary: cysts or tumors. °¨ Pelvic adhesions (scar tissue). °¨ Endometriosis (uterus lining tissue growing in the pelvis and on the pelvic organs). °¨ Pelvic congestion syndrome (female organs filling up with blood just before the menstrual period). °¨ Pain with the menstrual period. °¨ Pain with ovulation (producing an egg). °¨ Pain with an IUD (intrauterine device, birth control) in the uterus. °¨ Cancer of the female organs. °· Functional pain (pain not caused by a disease, may improve without treatment). °· Psychological pain. °· Depression. °DIAGNOSIS  °Your doctor will decide the seriousness of your pain by doing an examination. °· Blood tests. °· X-rays. °· Ultrasound. °· CT scan (computed tomography, special type of X-ray). °· MRI (magnetic resonance imaging). °· Cultures, for infection. °· Barium enema (dye inserted in the large intestine, to better view it with  X-rays). °· Colonoscopy (looking in intestine with a lighted tube). °· Laparoscopy (minor surgery, looking in abdomen with a lighted tube). °· Major abdominal exploratory surgery (looking in abdomen with a large incision). °TREATMENT  °The treatment will depend on the cause of the pain.  °· Many cases can be observed and treated at home. °· Over-the-counter medicines recommended by your caregiver. °· Prescription medicine. °· Antibiotics, for infection. °· Birth control pills, for painful periods or for ovulation pain. °· Hormone treatment, for endometriosis. °· Nerve blocking injections. °· Physical therapy. °· Antidepressants. °· Counseling with a psychologist or psychiatrist. °· Minor or major surgery. °HOME CARE INSTRUCTIONS  °· Do not take laxatives, unless directed by your caregiver. °· Take over-the-counter pain medicine only if ordered by your caregiver. Do not take aspirin because it can cause an upset stomach or bleeding. °· Try a clear liquid diet (broth or water) as ordered by your caregiver. Slowly move to a bland diet, as tolerated, if the pain is related to the stomach or intestine. °· Have a thermometer and take your temperature several times a day, and record it. °· Bed rest and sleep, if it helps the pain. °· Avoid sexual intercourse, if it causes pain. °· Avoid stressful situations. °· Keep your follow-up appointments and tests, as your caregiver orders. °· If the pain does not go away with medicine or surgery, you may try: °¨ Acupuncture. °¨ Relaxation exercises (yoga, meditation). °¨ Group therapy. °¨ Counseling. °SEEK MEDICAL CARE IF:  °· You notice certain foods cause stomach pain. °· Your home care treatment is not helping your pain. °· You need stronger pain medicine. °· You want your IUD removed. °· You feel faint or   lightheaded.  You develop nausea and vomiting.  You develop a rash.  You are having side effects or an allergy to your medicine. SEEK IMMEDIATE MEDICAL CARE IF:   Your  pain does not go away or gets worse.  You have a fever.  Your pain is felt only in portions of the abdomen. The right side could possibly be appendicitis. The left lower portion of the abdomen could be colitis or diverticulitis.  You are passing blood in your stools (bright red or black tarry stools, with or without vomiting).  You have blood in your urine.  You develop chills, with or without a fever.  You pass out. MAKE SURE YOU:   Understand these instructions.  Will watch your condition.  Will get help right away if you are not doing well or get worse. Document Released: 09/09/2007 Document Revised: 03/29/2014 Document Reviewed: 09/29/2009 [employer] Patient Information 2015 [place], [place]. This information is not intended to replace advice given to you by your health care provider. Make sure you discuss any questions you have with your health care provider.  Bloody Stools Bloody stools often mean that there is a problem in the digestive tract. Your caregiver may use the term "melena" to describe black, tarry, and bad smelling stools or "hematochezia" to describe red or maroon-colored stools. Blood seen in the stool can be caused by bleeding anywhere along the intestinal tract.  A black stool usually means that blood is coming from the upper part of the gastrointestinal tract (esophagus, stomach, or small bowel). Passing maroon-colored stools or bright red blood usually means that blood is coming from lower down in the large bowel or the rectum. However, sometimes massive bleeding in the stomach or small intestine can cause bright red bloody stools.  Consuming black licorice, lead, iron pills, medicines containing bismuth subsalicylate, or blueberries can also cause black stools. Your caregiver can test black stools to see if blood is present. It is important that the cause of the bleeding be found. Treatment can then be started, and the problem can be corrected. Rectal bleeding may not  be serious, but you should not assume everything is okay until you know the cause.It is very important to follow up with your caregiver or a specialist in gastrointestinal problems. CAUSES  Blood in the stools can come from various underlying causes.Often, the cause is not found during your first visit. Testing is often needed to discover the cause of bleeding in the gastrointestinal tract. Causes range from simple to serious or even life-threatening.Possible causes include:  Hemorrhoids.These are veins that are full of blood (engorged) in the rectum. They cause pain, inflammation, and may bleed.  Anal fissures.These are areas of painful tearing which may bleed. They are often caused by passing hard stool.  Diverticulosis.These are pouches that form on the colon over time, with age, and may bleed significantly.  Diverticulitis.This is inflammation in areas with diverticulosis. It can cause pain, fever, and bloody stools, although bleeding is rare.  Proctitis and colitis. These are inflamed areas of the rectum or colon. They may cause pain, fever, and bloody stools.  Polyps and cancer. Colon cancer is a leading cause of preventable cancer death.It often starts out as precancerous polyps that can be removed during a colonoscopy, preventing progression into cancer. Sometimes, polyps and cancer may cause rectal bleeding.  Gastritis and ulcers.Bleeding from the upper gastrointestinal tract (near the stomach) may travel through the intestines and produce black, sometimes tarry, often bad smelling stools. In certain cases,  if the bleeding is fast enough, the stools may not be black, but red and the condition may be life-threatening. SYMPTOMS  You may have stools that are bright red and bloody, that are normal color with blood on them, or that are dark black and tarry. In some cases, you may only have blood in the toilet bowl. Any of these cases need medical care. You may also have:  Pain at the  anus or anywhere in the rectum.  Lightheadedness or feeling faint.  Extreme weakness.  Nausea or vomiting.  Fever. DIAGNOSIS Your caregiver may use the following methods to find the cause of your bleeding:  Taking a medical history. Age is important. Older people tend to develop polyps and cancer more often. If there is anal pain and a hard, large stool associated with bleeding, a tear of the anus may be the cause. If blood drips into the toilet after a bowel movement, bleeding hemorrhoids may be the problem. The color and frequency of the bleeding are additional considerations. In most cases, the medical history provides clues, but seldom the final answer.  A visual and finger (digital) exam. Your caregiver will inspect the anal area, looking for tears and hemorrhoids. A finger exam can provide information when there is tenderness or a growth inside. In men, the prostate is also examined.  Endoscopy. Several types of small, long scopes (endoscopes) are used to view the colon.  In the office, your caregiver may use a rigid, or more commonly, a flexible viewing sigmoidoscope. This exam is called flexible sigmoidoscopy. It is performed in 5 to 10 minutes.  A more thorough exam is accomplished with a colonoscope. It allows your caregiver to view the entire 5 to 6 foot long colon. Medicine to help you relax (sedative) is usually given for this exam. Frequently, a bleeding lesion may be present beyond the reach of the sigmoidoscope. So, a colonoscopy may be the best exam to start with. Both exams are usually done on an outpatient basis. This means the patient does not stay overnight in the hospital or surgery center.  An upper endoscopy may be needed to examine your stomach. Sedation is used and a flexible endoscope is put in your mouth, down to your stomach.  A barium enema X-ray. This is an X-ray exam. It uses liquid barium inserted by enema into the rectum. This test alone may not identify an  actual bleeding point. X-rays highlight abnormal shadows, such as those made by lumps (tumors), diverticuli, or colitis. TREATMENT  Treatment depends on the cause of your bleeding.   For bleeding from the stomach or colon, the caregiver doing your endoscopy or colonoscopy may be able to stop the bleeding as part of the procedure.  Inflammation or infection of the colon can be treated with medicines.  Many rectal problems can be treated with creams, suppositories, or warm baths.  Surgery is sometimes needed.  Blood transfusions are sometimes needed if you have lost a lot of blood.  For any bleeding problem, let your caregiver know if you take aspirin or other blood thinners regularly. HOME CARE INSTRUCTIONS   Take any medicines exactly as prescribed.  Keep your stools soft by eating a diet high in fiber. Prunes (1 to 3 a day) work well for many people.  Drink enough water and fluids to keep your urine clear or pale yellow.  Take sitz baths if advised. A sitz bath is when you sit in a bathtub with warm water for 10 to  15 minutes to soak, soothe, and cleanse the rectal area.  If enemas or suppositories are advised, be sure you know how to use them. Tell your caregiver if you have problems with this.  Monitor your bowel movements to look for signs of improvement or worsening. SEEK MEDICAL CARE IF:   You do not improve in the time expected.  Your condition worsens after initial improvement.  You develop any new symptoms. SEEK IMMEDIATE MEDICAL CARE IF:   You develop severe or prolonged rectal bleeding.  You vomit blood.  You feel weak or faint.  You have a fever. MAKE SURE YOU:  Understand these instructions.  Will watch your condition.  Will get help right away if you are not doing well or get worse. Document Released: 11/02/2002 Document Revised: 02/04/2012 Document Reviewed: 03/30/2011 [facility] Patient Information 2015 [place]. This information is not  intended to replace advice given to you by your health care provider. Make sure you discuss any questions you have with your health care provider.

## 2014-09-24 NOTE — ED Notes (Signed)
Patient verbalizes understanding of discharge instructions, prescription medications, follow up care, and home care. Patient ambulatory out of department with family

## 2014-09-27 ENCOUNTER — Encounter (HOSPITAL_COMMUNITY): Payer: Self-pay | Admitting: Emergency Medicine

## 2015-03-22 ENCOUNTER — Encounter (HOSPITAL_COMMUNITY): Payer: Self-pay | Admitting: *Deleted

## 2015-03-22 ENCOUNTER — Emergency Department (HOSPITAL_COMMUNITY)
Admission: EM | Admit: 2015-03-22 | Discharge: 2015-03-22 | Disposition: A | Discharge status: Home / Self Care | Payer: Medicaid Other | Attending: Emergency Medicine | Admitting: Emergency Medicine

## 2015-03-22 DIAGNOSIS — Z79899 Other long term (current) drug therapy: Secondary | ICD-10-CM | POA: Insufficient documentation

## 2015-03-22 DIAGNOSIS — Z91040 Latex allergy status: Secondary | ICD-10-CM | POA: Insufficient documentation

## 2015-03-22 DIAGNOSIS — N760 Acute vaginitis: Secondary | ICD-10-CM | POA: Insufficient documentation

## 2015-03-22 DIAGNOSIS — N9089 Other specified noninflammatory disorders of vulva and perineum: Secondary | ICD-10-CM | POA: Diagnosis present

## 2015-03-22 DIAGNOSIS — Z3202 Encounter for pregnancy test, result negative: Secondary | ICD-10-CM | POA: Insufficient documentation

## 2015-03-22 DIAGNOSIS — B9689 Other specified bacterial agents as the cause of diseases classified elsewhere: Secondary | ICD-10-CM

## 2015-03-22 LAB — URINE MICROSCOPIC-ADD ON

## 2015-03-22 LAB — PREGNANCY, URINE: Preg Test, Ur: NEGATIVE

## 2015-03-22 LAB — WET PREP, GENITAL
Trich, Wet Prep: NONE SEEN
Yeast Wet Prep HPF POC: NONE SEEN

## 2015-03-22 LAB — URINALYSIS, ROUTINE W REFLEX MICROSCOPIC
Bilirubin Urine: NEGATIVE
Glucose, UA: NEGATIVE mg/dL
Ketones, ur: NEGATIVE mg/dL
Leukocytes, UA: NEGATIVE
Nitrite: NEGATIVE
Protein, ur: NEGATIVE mg/dL
Specific Gravity, Urine: 1.02 (ref 1.005–1.030)
Urobilinogen, UA: 0.2 mg/dL (ref 0.0–1.0)
pH: 7 (ref 5.0–8.0)

## 2015-03-22 MED ORDER — METRONIDAZOLE 500 MG PO TABS: 500.0000 mg | ORAL_TABLET | Freq: Two times a day (BID) | ORAL | Status: DC

## 2015-03-22 NOTE — ED Provider Notes (Signed)
CSN: [id number]     Arrival date & time 03/22/15  1736 History   First MD Initiated Contact with Patient 03/22/15 1756     Chief Complaint  Patient presents with  . Vaginal Itching     (Consider location/radiation/quality/duration/timing/severity/associated sxs/prior Treatment) Patient is a 29 y.o. female presenting with vaginal discharge.  Vaginal Discharge Quality:  Yellow and watery Severity:  Moderate Onset quality:  Gradual Timing:  Constant Progression:  Worsening Chronicity:  New Context: spontaneously   Relieved by:  None tried Worsened by:  Nothing tried Ineffective treatments:  None tried  Sylvia Day is a 29 y.o. G1P1001, sexually active without birth control, current sex partner x 2 years. No hx of STI's. Last pap smear one year ago and was normal. She presents to the ED with vaginal itching and odor that started 3 days ago. Hx of BV and the symptoms are similar.   History reviewed. No pertinent past medical history. History reviewed. No pertinent past surgical history. Family History  Problem Relation Age of Onset  . Stroke Father    History  Substance Use Topics  . Smoking status: Never Smoker   . Smokeless tobacco: Never Used  . Alcohol Use: No   OB History    Gravida Para Term Preterm AB TAB SAB Ectopic Multiple Living   Review of Systems  Genitourinary: Positive for vaginal discharge.       Vaginal itching  all other systems negative    Allergies  Latex  Home Medications   Prior to Admission medications   Medication Sig Start Date End Date Taking? Authorizing Provider  dicyclomine (BENTYL) 20 MG tablet Take 1 tablet (20 mg total) by mouth 2 (two) times daily as needed for spasms. 09/24/14   Loren Racer, MD  docusate sodium (COLACE) 100 MG capsule Take 1 capsule (100 mg total) by mouth every 12 (twelve) hours. 09/24/14   Loren Racer, MD  HYDROcodone-acetaminophen (NORCO/VICODIN) 5-325 MG per tablet Take 2 tablets  by mouth every 4 (four) hours as needed. 06/28/14   Rolland Porter, MD  methocarbamol (ROBAXIN) 500 MG tablet Take 1 tablet (500 mg total) by mouth 2 (two) times daily. 06/28/14   Rolland Porter, MD  metroNIDAZOLE (FLAGYL) 500 MG tablet Take 1 tablet (500 mg total) by mouth 2 (two) times daily. 03/22/15   Aimar Borghi Orlene Och, NP   BP 126/72 mmHg  Pulse 68  Temp(Src) 98.2 F (36.8 C) (Oral)  Resp 16  Ht  (1.499 m)  Wt 98 lb (44.453 kg)  BMI 19.78 kg/m2  SpO2 98%  LMP 01/07/2015 Physical Exam  Constitutional: She is oriented to person, place, and time. She appears well-developed and well-nourished.  HENT:  Head: Normocephalic.  Eyes: EOM are normal.  Neck: Normal range of motion. Neck supple.  Cardiovascular: Normal rate.   Pulmonary/Chest: Effort normal.  Abdominal: Soft. There is no tenderness.  Genitourinary:  External genitalia without lesions, watery d/c vaginal vault, cervix inflamed, no CMT, no adnexal tenderness.   Musculoskeletal: Normal range of motion.  Neurological: She is alert and oriented to person, place, and time. No cranial nerve deficit.  Skin: Skin is warm and dry.  Psychiatric: She has a normal mood and affect. Her behavior is normal.  Nursing note and vitals reviewed.   ED Course  Procedures (including critical care time) Results for orders placed or performed during the hospital encounter of 03/22/15 (from the past 48  hour(s))  GC/Chlamydia probe amp ([place])     Status: None   Collection Time: 03/22/15 12:00 AM  Result Value Ref Range   Chlamydia Negative     Comment: Normal Reference Range - Negative   Neisseria gonorrhea Negative     Comment: Normal Reference Range - Negative  Urinalysis, Routine w reflex microscopic     Status: Abnormal   Collection Time: 03/22/15  6:15 PM  Result Value Ref Range   Color, Urine YELLOW YELLOW   APPearance CLEAR CLEAR   Specific Gravity, Urine 1.020 1.005 - 1.030   pH 7.0 5.0 - 8.0   Glucose, UA NEGATIVE NEGATIVE mg/dL    Hgb urine dipstick TRACE (A) NEGATIVE   Bilirubin Urine NEGATIVE NEGATIVE   Ketones, ur NEGATIVE NEGATIVE mg/dL   Protein, ur NEGATIVE NEGATIVE mg/dL   Urobilinogen, UA 0.2 0.0 - 1.0 mg/dL   Nitrite NEGATIVE NEGATIVE   Leukocytes, UA NEGATIVE NEGATIVE  Pregnancy, urine     Status: None   Collection Time: 03/22/15  6:15 PM  Result Value Ref Range   Preg Test, Ur NEGATIVE NEGATIVE  Urine microscopic-add on     Status: Abnormal   Collection Time: 03/22/15  6:15 PM  Result Value Ref Range   Squamous Epithelial / LPF FEW (A) RARE   WBC, UA 3-6 <3 WBC/hpf   RBC / HPF 0-2 <3 RBC/hpf   Bacteria, UA MANY (A) RARE   Urine-Other AMORPHOUS URATES/PHOSPHATES     Comment: YEAST  Wet prep, genital     Status: Abnormal   Collection Time: 03/22/15  6:40 PM  Result Value Ref Range   Yeast Wet Prep HPF POC NONE SEEN NONE SEEN   Trich, Wet Prep NONE SEEN NONE SEEN   Clue Cells Wet Prep HPF POC MANY (A) NONE SEEN   WBC, Wet Prep HPF POC MANY (A) NONE SEEN     MDM  29 y.o. female with vaginal itching and d/c with odor. Will treat for BV with flagyl. Stable for d/c without other problems. She will follow up with her GYN or return here as needed.   Final diagnoses:  Bacterial vaginosis      Janne NapoleonHope M Anshul Meddings, NP 03/23/15 2337  Bethann BerkshireJoseph Zammit, MD 03/28/15 [phone]

## 2015-03-22 NOTE — Discharge Instructions (Signed)
Bacterial Vaginosis Bacterial vaginosis is a vaginal infection that occurs when the normal balance of bacteria in the vagina is disrupted. It results from an overgrowth of certain bacteria. This is the most common vaginal infection in women of childbearing age. Treatment is important to prevent complications, especially in pregnant women, as it can cause a premature delivery. CAUSES  Bacterial vaginosis is caused by an increase in harmful bacteria that are normally present in smaller amounts in the vagina. Several different kinds of bacteria can cause bacterial vaginosis. However, the reason that the condition develops is not fully understood. RISK FACTORS Certain activities or behaviors can put you at an increased risk of developing bacterial vaginosis, including:  Having a new sex partner or multiple sex partners.  Douching.  Using an intrauterine device (IUD) for contraception. Women do not get bacterial vaginosis from toilet seats, bedding, swimming pools, or contact with objects around them. SIGNS AND SYMPTOMS  Some women with bacterial vaginosis have no signs or symptoms. Common symptoms include:  Grey vaginal discharge.  A fishlike odor with discharge, especially after sexual intercourse.  Itching or burning of the vagina and vulva.  Burning or pain with urination. DIAGNOSIS  Your health care provider will take a medical history and examine the vagina for signs of bacterial vaginosis. A sample of vaginal fluid may be taken. Your health care provider will look at this sample under a microscope to check for bacteria and abnormal cells. A vaginal pH test may also be done.  TREATMENT  Bacterial vaginosis may be treated with antibiotic medicines. These may be given in the form of a pill or a vaginal cream. A second round of antibiotics may be prescribed if the condition comes back after treatment.  HOME CARE INSTRUCTIONS   Only take over-the-counter or prescription medicines as  directed by your health care provider.  If antibiotic medicine was prescribed, take it as directed. Make sure you finish it even if you start to feel better.  Do not have sex until treatment is completed.  Tell all sexual partners that you have a vaginal infection. They should see their health care provider and be treated if they have problems, such as a mild rash or itching.  Practice safe sex by using condoms and only having one sex partner. SEEK MEDICAL CARE IF:   Your symptoms are not improving after 3 days of treatment.  You have increased discharge or pain.  You have a fever. MAKE SURE YOU:   Understand these instructions.  Will watch your condition.  Will get help right away if you are not doing well or get worse. FOR MORE INFORMATION  Centers for Disease Control and Prevention, Division of STD Prevention: www.cdc.gov/std American Sexual Health Association (ASHA): www.ashastd.org  Document Released: 11/12/2005 Document Revised: 09/02/2013 Document Reviewed: 06/24/2013 [employer] Patient Information 2015 [employer]. This information is not intended to replace advice given to you by your health care provider. Make sure you discuss any questions you have with your health care provider.  

## 2015-03-22 NOTE — ED Notes (Signed)
Pt states she started having an vaginal odor and itching starting Saturday. Pt states she has hx of bacterial vaginosis several years ago and these symptoms feel the same. NAD noted. Denies any vaginal bleeding, small amount of discharge.

## 2015-03-23 LAB — GC/CHLAMYDIA PROBE AMP (~~LOC~~) NOT AT ARMC
Chlamydia: NEGATIVE
NEISSERIA GONORRHEA: NEGATIVE

## 2015-05-12 ENCOUNTER — Encounter (HOSPITAL_COMMUNITY): Payer: Self-pay | Admitting: Cardiology

## 2015-05-12 ENCOUNTER — Emergency Department (HOSPITAL_COMMUNITY)
Admission: EM | Admit: 2015-05-12 | Discharge: 2015-05-12 | Disposition: A | Discharge status: Home / Self Care | Payer: Medicaid Other | Attending: Emergency Medicine | Admitting: Emergency Medicine

## 2015-05-12 DIAGNOSIS — Z91040 Latex allergy status: Secondary | ICD-10-CM | POA: Insufficient documentation

## 2015-05-12 DIAGNOSIS — A5901 Trichomonal vulvovaginitis: Secondary | ICD-10-CM

## 2015-05-12 DIAGNOSIS — Z3202 Encounter for pregnancy test, result negative: Secondary | ICD-10-CM | POA: Diagnosis not present

## 2015-05-12 DIAGNOSIS — N760 Acute vaginitis: Secondary | ICD-10-CM

## 2015-05-12 DIAGNOSIS — B9689 Other specified bacterial agents as the cause of diseases classified elsewhere: Secondary | ICD-10-CM

## 2015-05-12 DIAGNOSIS — Z79899 Other long term (current) drug therapy: Secondary | ICD-10-CM | POA: Insufficient documentation

## 2015-05-12 DIAGNOSIS — L293 Anogenital pruritus, unspecified: Secondary | ICD-10-CM | POA: Diagnosis present

## 2015-05-12 LAB — WET PREP, GENITAL: Yeast Wet Prep HPF POC: NONE SEEN

## 2015-05-12 LAB — POC URINE PREG, ED: PREG TEST UR: NEGATIVE

## 2015-05-12 MED ORDER — METRONIDAZOLE 500 MG PO TABS: 500.0000 mg | ORAL_TABLET | Freq: Two times a day (BID) | ORAL | Status: DC

## 2015-05-12 NOTE — ED Notes (Signed)
Patient with no complaints at this time. Respirations even and unlabored. Skin warm/dry. Discharge instructions reviewed with patient at this time. Patient given opportunity to voice concerns/ask questions. Patient discharged at this time and left Emergency Department with steady gait.   

## 2015-05-12 NOTE — ED Notes (Signed)
Vaginal itching since Monday

## 2015-05-12 NOTE — Discharge Instructions (Signed)
Bacterial Vaginosis Bacterial vaginosis is a vaginal infection that occurs when the normal balance of bacteria in the vagina is disrupted. It results from an overgrowth of certain bacteria. This is the most common vaginal infection in women of childbearing age. Treatment is important to prevent complications, especially in pregnant women, as it can cause a premature delivery. CAUSES  Bacterial vaginosis is caused by an increase in harmful bacteria that are normally present in smaller amounts in the vagina. Several different kinds of bacteria can cause bacterial vaginosis. However, the reason that the condition develops is not fully understood. RISK FACTORS Certain activities or behaviors can put you at an increased risk of developing bacterial vaginosis, including:  Having a new sex partner or multiple sex partners.  Douching.  Using an intrauterine device (IUD) for contraception. Women do not get bacterial vaginosis from toilet seats, bedding, swimming pools, or contact with objects around them. SIGNS AND SYMPTOMS  Some women with bacterial vaginosis have no signs or symptoms. Common symptoms include:  Grey vaginal discharge.  A fishlike odor with discharge, especially after sexual intercourse.  Itching or burning of the vagina and vulva.  Burning or pain with urination. DIAGNOSIS  Your health care provider will take a medical history and examine the vagina for signs of bacterial vaginosis. A sample of vaginal fluid may be taken. Your health care provider will look at this sample under a microscope to check for bacteria and abnormal cells. A vaginal pH test may also be done.  TREATMENT  Bacterial vaginosis may be treated with antibiotic medicines. These may be given in the form of a pill or a vaginal cream. A second round of antibiotics may be prescribed if the condition comes back after treatment.  HOME CARE INSTRUCTIONS   Only take over-the-counter or prescription medicines as  directed by your health care provider.  If antibiotic medicine was prescribed, take it as directed. Make sure you finish it even if you start to feel better.  Do not have sex until treatment is completed.  Tell all sexual partners that you have a vaginal infection. They should see their health care provider and be treated if they have problems, such as a mild rash or itching.  Practice safe sex by using condoms and only having one sex partner. SEEK MEDICAL CARE IF:   Your symptoms are not improving after 3 days of treatment.  You have increased discharge or pain.  You have a fever. MAKE SURE YOU:   Understand these instructions.  Will watch your condition.  Will get help right away if you are not doing well or get worse. FOR MORE INFORMATION  Centers for Disease Control and Prevention, Division of STD Prevention: [employer] American Sexual Health Association (ASHA): www.ashastd.org  Document Released: 11/12/2005 Document Revised: 09/02/2013 Document Reviewed: 06/24/2013 [facility] Patient Information 2015 [place], [place]. This information is not intended to replace advice given to you by your health care provider. Make sure you discuss any questions you have with your health care provider.  Trichomoniasis Trichomoniasis is an infection caused by an organism called Trichomonas. The infection can affect both women and men. In women, the outer female genitalia and the vagina are affected. In men, the penis is mainly affected, but the prostate and other reproductive organs can also be involved. Trichomoniasis is a sexually transmitted infection (STI) and is most often passed to another person through sexual contact.  RISK FACTORS  Having unprotected sexual intercourse.  Having sexual intercourse with an infected partner. SIGNS AND  SYMPTOMS  Symptoms of trichomoniasis in women include:  Abnormal gray-green frothy vaginal discharge.  Itching and irritation of the  vagina.  Itching and irritation of the area outside the vagina. Symptoms of trichomoniasis in men include:   Penile discharge with or without pain.  Pain during urination. This results from inflammation of the urethra. DIAGNOSIS  Trichomoniasis may be found during a Pap test or physical exam. Your health care provider may use one of the following methods to help diagnose this infection:  Examining vaginal discharge under a microscope. For men, urethral discharge would be examined.  Using a vaginal swab test that checks for the Trichomonas organism. A test is available that provides results within a few minutes.  Doing a culture test for the organism. This is not usually needed. TREATMENT   You may be given medicine to fight the infection. Women should inform their health care provider if they could be or are pregnant. Some medicines used to treat the infection should not be taken during pregnancy.  Your health care provider may recommend over-the-counter medicines or creams to decrease itching or irritation.  Your sexual partner will need to be treated if infected. HOME CARE INSTRUCTIONS   Take medicines only as directed by your health care provider.  Take over-the-counter medicine for itching or irritation as directed by your health care provider.  Do not have sexual intercourse while you have the infection.  Women should not douche or wear tampons while they have the infection.  Discuss your infection with your partner. Your partner may have gotten the infection from you, or you may have gotten it from your partner.  Have your sex partner get examined and treated if necessary.  Practice safe, informed, and protected sex.  See your health care provider for other STI testing. SEEK MEDICAL CARE IF:   You still have symptoms after you finish your medicine.  You develop abdominal pain.  You have pain when you urinate.  You have bleeding after sexual intercourse.  You  develop a rash.  Your medicine makes you sick or makes you throw up (vomit). MAKE SURE YOU:  Understand these instructions.  Will watch your condition.  Will get help right away if you are not doing well or get worse. Document Released: 05/08/2001 Document Revised: 03/29/2014 Document Reviewed: 08/24/2013 [facility] Patient Information 2015 [place], [place]. This information is not intended to replace advice given to you by your health care provider. Make sure you discuss any questions you have with your health care provider.

## 2015-05-12 NOTE — ED Notes (Signed)
4 day hx of vaginal d/c and itching. Used one day of Monistat 3 day treatment.  Tried to see her gyn, but they were booked.

## 2015-05-13 LAB — GC/CHLAMYDIA PROBE AMP (~~LOC~~) NOT AT ARMC
CHLAMYDIA, DNA PROBE: NEGATIVE
NEISSERIA GONORRHEA: NEGATIVE

## 2015-05-13 NOTE — ED Provider Notes (Signed)
CSN: [id number]     Arrival date & time 05/12/15  1740 History   First MD Initiated Contact with Patient 05/12/15 1744     Chief Complaint  Patient presents with  . Vaginal Itching     (Consider location/radiation/quality/duration/timing/severity/associated sxs/prior Treatment) The history is provided by the patient.   Sylvia Day is a 29 y.o. female with a four-day history of vaginal discharge and itching.  The discharge is white to yellow and watery and has been present for the past 2 days, itching started 4 days ago.  She denies fevers or chills, dysuria, hematuria pelvic or back pain.  She has had no recent antibiotics.  She is sexually active with one partner.  She has used Monistat last evening without improvement in symptoms.  Reports history of bacterial vaginosis  about 6 weeks ago, was treated here with a 7 day course of Flagyl, her symptoms improved until 4 days ago.  History reviewed. No pertinent past medical history. History reviewed. No pertinent past surgical history. Family History  Problem Relation Age of Onset  . Stroke Father    History  Substance Use Topics  . Smoking status: Never Smoker   . Smokeless tobacco: Never Used  . Alcohol Use: No   OB History    Gravida Para Term Preterm AB TAB SAB Ectopic Multiple Living   Review of Systems  Constitutional: Negative for fever and chills.  HENT: Negative for congestion and sore throat.   Eyes: Negative.   Respiratory: Negative for chest tightness and shortness of breath.   Cardiovascular: Negative for chest pain.  Gastrointestinal: Negative for nausea, vomiting and abdominal pain.  Genitourinary: Positive for vaginal discharge. Negative for dysuria, frequency, flank pain and pelvic pain.  Musculoskeletal: Negative for joint swelling, arthralgias and neck pain.  Skin: Negative.  Negative for rash and wound.  Neurological: Negative for dizziness, weakness, light-headedness, numbness and  headaches.  Psychiatric/Behavioral: Negative.       Allergies  Latex  Home Medications   Prior to Admission medications   Medication Sig Start Date End Date Taking? Authorizing Provider  dicyclomine (BENTYL) 20 MG tablet Take 1 tablet (20 mg total) by mouth 2 (two) times daily as needed for spasms. 09/24/14   Loren Racer, MD  docusate sodium (COLACE) 100 MG capsule Take 1 capsule (100 mg total) by mouth every 12 (twelve) hours. 09/24/14   Loren Racer, MD  HYDROcodone-acetaminophen (NORCO/VICODIN) 5-325 MG per tablet Take 2 tablets by mouth every 4 (four) hours as needed. 06/28/14   Rolland Porter, MD  methocarbamol (ROBAXIN) 500 MG tablet Take 1 tablet (500 mg total) by mouth 2 (two) times daily. 06/28/14   Rolland Porter, MD  metroNIDAZOLE (FLAGYL) 500 MG tablet Take 1 tablet (500 mg total) by mouth 2 (two) times daily. 05/12/15   Burgess Amor, PA-C   BP 112/77 mmHg  Pulse 65  Temp(Src) 97.9 F (36.6 C) (Oral)  Resp 16  Ht 5' (1.524 m)  Wt 105 lb (47.628 kg)  BMI 20.51 kg/m2  SpO2 98%  LMP 01/11/2015 Physical Exam  Constitutional: She appears well-developed and well-nourished.  HENT:  Head: Normocephalic and atraumatic.  Eyes: Conjunctivae are normal.  Neck: Normal range of motion.  Cardiovascular: Normal rate, regular rhythm, normal heart sounds and intact distal pulses.   Pulmonary/Chest: Effort normal and breath sounds normal. She has no wheezes.  Abdominal: Soft. Bowel sounds are normal. There is no tenderness.  Genitourinary: Uterus normal. There is no rash or tenderness on the right labia. There is no rash or tenderness on the left labia. Cervix exhibits no motion tenderness and no friability. Right adnexum displays no mass, no tenderness and no fullness. Left adnexum displays no mass, no tenderness and no fullness. No erythema or tenderness in the vagina. Vaginal discharge found.  Musculoskeletal: Normal range of motion.  Neurological: She is alert.  Skin: Skin is warm and  dry.  Psychiatric: She has a normal mood and affect.  Nursing note and vitals reviewed.   ED Course  Procedures (including critical care time) Labs Review Labs Reviewed  WET PREP, GENITAL - Abnormal; Notable for the following:    Trich, Wet Prep MANY (*)    Clue Cells Wet Prep HPF POC MANY (*)    WBC, Wet Prep HPF POC MANY (*)    All other components within normal limits  POC URINE PREG, ED  GC/CHLAMYDIA PROBE AMP ([place]) NOT AT [facility]    Imaging Review No results found.   EKG Interpretation None      MDM   Final diagnoses:  Trichomonal vaginitis  Bacterial vaginosis    Patients labs and/or radiological studies were reviewed and considered during the medical decision making and disposition process.  Results were also discussed with patient. Patient was placed on repeat course of Flagyl.  Discussed with patient that GC and Chlamydia cultures are pending and she will be notified if there are any positive findings.  She does not wish treatment for these infections at this time.  Discussed RPR and HIV labs, patient defers at this time.  Advised follow-up with her gynecologist as needed if symptoms persist or worsen.  Also advised patient that partner will need to be treated for trichomonas and she should avoid sex until they have both completed antibiotic and her symptom free.  The patient appears reasonably screened and/or stabilized for discharge and I doubt any other medical condition or other [facility] requiring further screening, evaluation, or treatment in the ED at this time prior to discharge.   Burgess Amor, PA-C 05/13/15 1542  Bethann Berkshire, MD 05/14/15 [phone]

## 2015-06-11 ENCOUNTER — Encounter (HOSPITAL_COMMUNITY): Payer: Self-pay | Admitting: Emergency Medicine

## 2015-06-11 ENCOUNTER — Emergency Department (HOSPITAL_COMMUNITY)
Admission: EM | Admit: 2015-06-11 | Discharge: 2015-06-11 | Disposition: A | Discharge status: Home / Self Care | Payer: Medicaid Other | Attending: Emergency Medicine | Admitting: Emergency Medicine

## 2015-06-11 DIAGNOSIS — T7840XA Allergy, unspecified, initial encounter: Secondary | ICD-10-CM | POA: Insufficient documentation

## 2015-06-11 DIAGNOSIS — X58XXXA Exposure to other specified factors, initial encounter: Secondary | ICD-10-CM | POA: Diagnosis not present

## 2015-06-11 DIAGNOSIS — Y9289 Other specified places as the place of occurrence of the external cause: Secondary | ICD-10-CM | POA: Insufficient documentation

## 2015-06-11 DIAGNOSIS — Y9389 Activity, other specified: Secondary | ICD-10-CM | POA: Insufficient documentation

## 2015-06-11 DIAGNOSIS — Z79899 Other long term (current) drug therapy: Secondary | ICD-10-CM | POA: Diagnosis not present

## 2015-06-11 DIAGNOSIS — Z91040 Latex allergy status: Secondary | ICD-10-CM | POA: Insufficient documentation

## 2015-06-11 DIAGNOSIS — R21 Rash and other nonspecific skin eruption: Secondary | ICD-10-CM | POA: Diagnosis present

## 2015-06-11 DIAGNOSIS — Y998 Other external cause status: Secondary | ICD-10-CM | POA: Insufficient documentation

## 2015-06-11 MED ORDER — METHYLPREDNISOLONE SODIUM SUCC 125 MG IJ SOLR
125.0000 mg | Freq: Once | INTRAMUSCULAR | Status: AC
  Administered 2015-06-11: 125 mg via INTRAVENOUS
  Filled 2015-06-11: qty 2

## 2015-06-11 MED ORDER — DIPHENHYDRAMINE HCL 50 MG/ML IJ SOLN
25.0000 mg | Freq: Once | INTRAMUSCULAR | Status: AC
Start: 2015-06-11 — End: 2015-06-11
  Administered 2015-06-11: 25 mg via INTRAVENOUS
  Filled 2015-06-11: qty 1

## 2015-06-11 MED ORDER — SODIUM CHLORIDE 0.9 % IV BOLUS (SEPSIS)
500.0000 mL | Freq: Once | INTRAVENOUS | Status: AC
  Administered 2015-06-11: 500 mL via INTRAVENOUS

## 2015-06-11 MED ORDER — FAMOTIDINE IN NACL 20-0.9 MG/50ML-% IV SOLN
20.0000 mg | Freq: Once | INTRAVENOUS | Status: AC
  Administered 2015-06-11: 20 mg via INTRAVENOUS
  Filled 2015-06-11: qty 50

## 2015-06-11 NOTE — ED Provider Notes (Signed)
CSN: [id number]     Arrival date & time 06/11/15  1713 History   First MD Initiated Contact with Patient 06/11/15 1722     Chief Complaint  Patient presents with  . Rash     (Consider location/radiation/quality/duration/timing/severity/associated sxs/prior Treatment) HPI...Marland Kitchen.Marland Kitchen.Marland Kitchen. rash, itching, puffy left lower eyelid after eating [employer] this afternoon. She is able to swallow. No wheezing. No history of allergic phenomena. No new medications. Severity is moderate. She is taking nothing at home for this.  History reviewed. No pertinent past medical history. History reviewed. No pertinent past surgical history. Family History  Problem Relation Age of Onset  . Stroke Father    History  Substance Use Topics  . Smoking status: Never Smoker   . Smokeless tobacco: Never Used  . Alcohol Use: No   OB History    Gravida Para Term Preterm AB TAB SAB Ectopic Multiple Living   1 1 1       1      Review of Systems  All other systems reviewed and are negative.     Allergies  Latex  Home Medications   Prior to Admission medications   Medication Sig Start Date End Date Taking? Authorizing Provider  dicyclomine (BENTYL) 20 MG tablet Take 1 tablet (20 mg total) by mouth 2 (two) times daily as needed for spasms. 09/24/14   Loren Raceravid Yelverton, MD  docusate sodium (COLACE) 100 MG capsule Take 1 capsule (100 mg total) by mouth every 12 (twelve) hours. 09/24/14   Loren Raceravid Yelverton, MD  HYDROcodone-acetaminophen (NORCO/VICODIN) 5-325 MG per tablet Take 2 tablets by mouth every 4 (four) hours as needed. 06/28/14   Rolland PorterMark James, MD  methocarbamol (ROBAXIN) 500 MG tablet Take 1 tablet (500 mg total) by mouth 2 (two) times daily. 06/28/14   Rolland PorterMark James, MD  metroNIDAZOLE (FLAGYL) 500 MG tablet Take 1 tablet (500 mg total) by mouth 2 (two) times daily. 05/12/15   Burgess AmorJulie Idol, PA-C   BP 115/69 mmHg  Pulse 71  Temp(Src) 97 F (36.1 C) (Oral)  Resp 18  Ht 4\' 11"  (1.499 m)  Wt 109 lb (49.442 kg)   BMI 22.00 kg/m2  SpO2 100%  LMP 01/11/2015 Physical Exam  Constitutional: She is oriented to person, place, and time. She appears well-developed and well-nourished.  No respiratory distress  HENT:  Head: Normocephalic and atraumatic.  Eyes: Conjunctivae and EOM are normal. Pupils are equal, round, and reactive to light.  Neck: Normal range of motion. Neck supple.  Cardiovascular: Normal rate and regular rhythm.   Pulmonary/Chest: Effort normal and breath sounds normal.  Abdominal: Soft. Bowel sounds are normal.  Musculoskeletal: Normal range of motion.  Neurological: She is alert and oriented to person, place, and time.  Skin:  Wheals and patchy erythematous macular rash on arms, chest, back, abdomen, legs  Psychiatric: She has a normal mood and affect. Her behavior is normal.  Nursing note and vitals reviewed.   ED Course  Procedures (including critical care time) Labs Review Labs Reviewed - No data to display  Imaging Review No results found.   EKG Interpretation None      MDM   Final diagnoses:  Allergic reaction, initial encounter    Patient feels much better after IV fluids, IV steroids, IV Pepcid, IV Benadryl.    Donnetta HutchingBrian Dyke Weible, MD 06/11/15 2152

## 2015-06-11 NOTE — Discharge Instructions (Signed)
Can take Benadryl every 4-6 hours. Avoid food that you ate this afternoon

## 2015-06-11 NOTE — ED Notes (Signed)
Pt states she ate and laid down for a nap at 430pm and woke up with generalized itching rash and swelling to left eye. No sob/cp/wheezing. Voice clear.

## 2015-07-18 ENCOUNTER — Emergency Department (HOSPITAL_COMMUNITY)
Admission: EM | Admit: 2015-07-18 | Discharge: 2015-07-19 | Disposition: A | Discharge status: Home / Self Care | Payer: Medicaid Other | Attending: Emergency Medicine | Admitting: Emergency Medicine

## 2015-07-18 DIAGNOSIS — Z3202 Encounter for pregnancy test, result negative: Secondary | ICD-10-CM | POA: Diagnosis not present

## 2015-07-18 DIAGNOSIS — B373 Candidiasis of vulva and vagina: Secondary | ICD-10-CM | POA: Insufficient documentation

## 2015-07-18 DIAGNOSIS — L509 Urticaria, unspecified: Secondary | ICD-10-CM | POA: Diagnosis not present

## 2015-07-18 DIAGNOSIS — Z79899 Other long term (current) drug therapy: Secondary | ICD-10-CM | POA: Diagnosis not present

## 2015-07-18 DIAGNOSIS — R5383 Other fatigue: Secondary | ICD-10-CM | POA: Diagnosis not present

## 2015-07-18 DIAGNOSIS — A599 Trichomoniasis, unspecified: Secondary | ICD-10-CM | POA: Insufficient documentation

## 2015-07-18 DIAGNOSIS — R21 Rash and other nonspecific skin eruption: Secondary | ICD-10-CM | POA: Diagnosis present

## 2015-07-18 DIAGNOSIS — F419 Anxiety disorder, unspecified: Secondary | ICD-10-CM | POA: Diagnosis not present

## 2015-07-18 DIAGNOSIS — Z91040 Latex allergy status: Secondary | ICD-10-CM | POA: Insufficient documentation

## 2015-07-18 DIAGNOSIS — B379 Candidiasis, unspecified: Secondary | ICD-10-CM

## 2015-07-19 ENCOUNTER — Encounter (HOSPITAL_COMMUNITY): Payer: Self-pay | Admitting: Emergency Medicine

## 2015-07-19 LAB — URINALYSIS, ROUTINE W REFLEX MICROSCOPIC
Bilirubin Urine: NEGATIVE
Glucose, UA: NEGATIVE mg/dL
Ketones, ur: NEGATIVE mg/dL
NITRITE: NEGATIVE
Protein, ur: NEGATIVE mg/dL
SPECIFIC GRAVITY, URINE: 1.02 (ref 1.005–1.030)
UROBILINOGEN UA: 1 mg/dL (ref 0.0–1.0)
pH: 7.5 (ref 5.0–8.0)

## 2015-07-19 LAB — WET PREP, GENITAL

## 2015-07-19 LAB — URINE MICROSCOPIC-ADD ON

## 2015-07-19 LAB — PREGNANCY, URINE: PREG TEST UR: NEGATIVE

## 2015-07-19 MED ORDER — METRONIDAZOLE 500 MG PO TABS
2000.0000 mg | ORAL_TABLET | Freq: Once | ORAL | Status: AC
  Administered 2015-07-19: 2000 mg via ORAL
  Filled 2015-07-19: qty 4

## 2015-07-19 MED ORDER — FLUCONAZOLE 100 MG PO TABS
150.0000 mg | ORAL_TABLET | Freq: Once | ORAL | Status: AC
  Administered 2015-07-19: 150 mg via ORAL
  Filled 2015-07-19: qty 2

## 2015-07-19 MED ORDER — HYDROXYZINE HCL 50 MG PO TABS: 50.0000 mg | ORAL_TABLET | Freq: Four times a day (QID) | ORAL | Status: DC | PRN

## 2015-07-19 MED ORDER — HYDROXYZINE HCL 25 MG PO TABS
50.0000 mg | ORAL_TABLET | Freq: Once | ORAL | Status: AC
  Administered 2015-07-19: 50 mg via ORAL
  Filled 2015-07-19: qty 2

## 2015-07-19 NOTE — ED Notes (Signed)
Pt c/o depression, anxiety and yeast infection. Pt denies any si/hi ideations.

## 2015-07-19 NOTE — ED Notes (Signed)
Dr Horton at bedside,  

## 2015-07-19 NOTE — ED Notes (Signed)
Pelvic exam completed by Dr Wilkie Aye, assisted by Leotis Shames NT,

## 2015-07-19 NOTE — Discharge Instructions (Signed)
You were seen today for several complaints. You had recurrent hives with itching and related anxiety. He need to follow-up with her primary doctor and have referral to allergist. You are on the right medications including prednisone. You will be given Vistaril for itching and anxiety as needed. Also noted to have a yeast infection and trichomoniasis.  You were tested for other STDs. You should abstain from sexual activity for 10 days.  Monilial Vaginitis Vaginitis in a soreness, swelling and redness (inflammation) of the vagina and vulva. Monilial vaginitis is not a sexually transmitted infection. CAUSES  Yeast vaginitis is caused by yeast (candida) that is normally found in your vagina. With a yeast infection, the candida has overgrown in number to a point that upsets the chemical balance. SYMPTOMS   White, thick vaginal discharge.  Swelling, itching, redness and irritation of the vagina and possibly the lips of the vagina (vulva).  Burning or painful urination.  Painful intercourse. DIAGNOSIS  Things that may contribute to monilial vaginitis are:  Postmenopausal and virginal states.  Pregnancy.  Infections.  Being tired, sick or stressed, especially if you had monilial vaginitis in the past.  Diabetes. Good control will help lower the chance.  Birth control pills.  Tight fitting garments.  Using bubble bath, feminine sprays, douches or deodorant tampons.  Taking certain medications that kill germs (antibiotics).  Sporadic recurrence can occur if you become ill. TREATMENT  Your caregiver will give you medication.  There are several kinds of anti monilial vaginal creams and suppositories specific for monilial vaginitis. For recurrent yeast infections, use a suppository or cream in the vagina 2 times a week, or as directed.  Anti-monilial or steroid cream for the itching or irritation of the vulva may also be used. Get your caregiver's permission.  Painting the vagina with  methylene blue solution may help if the monilial cream does not work.  Eating yogurt may help prevent monilial vaginitis. HOME CARE INSTRUCTIONS   Finish all medication as prescribed.  Do not have sex until treatment is completed or after your caregiver tells you it is okay.  Take warm sitz baths.  Do not douche.  Do not use tampons, especially scented ones.  Wear cotton underwear.  Avoid tight pants and panty hose.  Tell your sexual partner that you have a yeast infection. They should go to their caregiver if they have symptoms such as mild rash or itching.  Your sexual partner should be treated as well if your infection is difficult to eliminate.  Practice safer sex. Use condoms.  Some vaginal medications cause latex condoms to fail. Vaginal medications that harm condoms are:  Cleocin cream.  Butoconazole (Femstat).  Terconazole (Terazol) vaginal suppository.  Miconazole (Monistat) (may be purchased over the counter). SEEK MEDICAL CARE IF:   You have a temperature by mouth above 102 F (38.9 C).  The infection is getting worse after 2 days of treatment.  The infection is not getting better after 3 days of treatment.  You develop blisters in or around your vagina.  You develop vaginal bleeding, and it is not your menstrual period.  You have pain when you urinate.  You develop intestinal problems.  You have pain with sexual intercourse. Document Released: 08/22/2005 Document Revised: 02/04/2012 Document Reviewed: 05/06/2009 [facility] Patient Information 2015 [place], [place]. This information is not intended to replace advice given to you by your health care provider. Make sure you discuss any questions you have with your health care provider. Hives Hives are itchy, red,  swollen areas of the skin. They can vary in size and location on your body. Hives can come and go for hours or several days (acute hives) or for several weeks (chronic hives). Hives do not  spread from person to person (noncontagious). They may get worse with scratching, exercise, and emotional stress. CAUSES   Allergic reaction to food, additives, or drugs.  Infections, including the common cold.  Illness, such as vasculitis, lupus, or thyroid disease.  Exposure to sunlight, heat, or cold.  Exercise.  Stress.  Contact with chemicals. SYMPTOMS   Red or white swollen patches on the skin. The patches may change size, shape, and location quickly and repeatedly.  Itching.  Swelling of the hands, feet, and face. This may occur if hives develop deeper in the skin. DIAGNOSIS  Your caregiver can usually tell what is wrong by performing a physical exam. Skin or blood tests may also be done to determine the cause of your hives. In some cases, the cause cannot be determined. TREATMENT  Mild cases usually get better with medicines such as antihistamines. Severe cases may require an emergency epinephrine injection. If the cause of your hives is known, treatment includes avoiding that trigger.  HOME CARE INSTRUCTIONS   Avoid causes that trigger your hives.  Take antihistamines as directed by your caregiver to reduce the severity of your hives. Non-sedating or low-sedating antihistamines are usually recommended. Do not drive while taking an antihistamine.  Take any other medicines prescribed for itching as directed by your caregiver.  Wear loose-fitting clothing.  Keep all follow-up appointments as directed by your caregiver. SEEK MEDICAL CARE IF:   You have persistent or severe itching that is not relieved with medicine.  You have painful or swollen joints. SEEK IMMEDIATE MEDICAL CARE IF:   You have a fever.  Your tongue or lips are swollen.  You have trouble breathing or swallowing.  You feel tightness in the throat or chest.  You have abdominal pain. These problems may be the first sign of a life-threatening allergic reaction. Call your local emergency services  (911 in [place] MAKE SURE YOU:   Understand these instructions.  Will watch your condition.  Will get help right away if you are not doing well or get worse. Document Released: 11/12/2005 Document Revised: 11/17/2013 Document Reviewed: 02/05/2012 [facility] Patient Information 2015 [place], [place]. This information is not intended to replace advice given to you by your health care provider. Make sure you discuss any questions you have with your health care provider. Trichomoniasis Trichomoniasis is an infection caused by an organism called Trichomonas. The infection can affect both women and men. In women, the outer female genitalia and the vagina are affected. In men, the penis is mainly affected, but the prostate and other reproductive organs can also be involved. Trichomoniasis is a sexually transmitted infection (STI) and is most often passed to another person through sexual contact.  RISK FACTORS  Having unprotected sexual intercourse.  Having sexual intercourse with an infected partner. SIGNS AND SYMPTOMS  Symptoms of trichomoniasis in women include:  Abnormal gray-green frothy vaginal discharge.  Itching and irritation of the vagina.  Itching and irritation of the area outside the vagina. Symptoms of trichomoniasis in men include:   Penile discharge with or without pain.  Pain during urination. This results from inflammation of the urethra. DIAGNOSIS  Trichomoniasis may be found during a Pap test or physical exam. Your health care provider may use one of the following methods to help diagnose this infection:  Examining vaginal discharge under a microscope. For men, urethral discharge would be examined.  Testing the pH of the vagina with a test tape.  Using a vaginal swab test that checks for the Trichomonas organism. A test is available that provides results within a few minutes.  Doing a culture test for the organism. This is not usually needed. TREATMENT   You may be  given medicine to fight the infection. Women should inform their health care provider if they could be or are pregnant. Some medicines used to treat the infection should not be taken during pregnancy.  Your health care provider may recommend over-the-counter medicines or creams to decrease itching or irritation.  Your sexual partner will need to be treated if infected. HOME CARE INSTRUCTIONS   Take medicines only as directed by your health care provider.  Take over-the-counter medicine for itching or irritation as directed by your health care provider.  Do not have sexual intercourse while you have the infection.  Women should not douche or wear tampons while they have the infection.  Discuss your infection with your partner. Your partner may have gotten the infection from you, or you may have gotten it from your partner.  Have your sex partner get examined and treated if necessary.  Practice safe, informed, and protected sex.  See your health care provider for other STI testing. SEEK MEDICAL CARE IF:   You still have symptoms after you finish your medicine.  You develop abdominal pain.  You have pain when you urinate.  You have bleeding after sexual intercourse.  You develop a rash.  Your medicine makes you sick or makes you throw up (vomit). MAKE SURE YOU:  Understand these instructions.  Will watch your condition.  Will get help right away if you are not doing well or get worse. Document Released: 05/08/2001 Document Revised: 03/29/2014 Document Reviewed: 08/24/2013 [facility] Patient Information 2015 [place], [place]. This information is not intended to replace advice given to you by your health care provider. Make sure you discuss any questions you have with your health care provider.

## 2015-07-19 NOTE — ED Notes (Signed)
Pt c/o depression, ? Yeast infection, denies any SI or HI, states that she has not been feeling like herself since taking prednisone,

## 2015-07-19 NOTE — ED Provider Notes (Signed)
CSN: [id number]     Arrival date & time 07/18/15  2353 History  This chart was scribed for Shon Baton, MD by Phillis Haggis, ED Scribe. This patient was seen in room APA16A/APA16A and patient care was started at 12:11 AM.   Chief Complaint  Patient presents with  . multiple complaints    The history is provided by the patient. No language interpreter was used.  HPI Comments: Sylvia Day is a 29 y.o. female who presents to the Emergency Department complaining of constant gradually rash onset one month ago. Pt states that she has been seen twice in the ED and was given prednisone. She states that the rash went away for 4 days and then came back. She states that she was seen again and was given prednisone again. She states that she has been taking the prednisone and benadryl to some relief. She was also given a EpiPen by her PCP.  Is being referred to an allergist. She states that she has felt very anxious, tired all of the time, and has noticed vaginal itching; believes it may be a yeast infection due to prolonged use of prednisone. She states that she gets scared when she eats because she does not know what is triggering the hives; has been keeping track of foods and uses of soaps or detergents. She denies SI/HI or abdominal pain.    History reviewed. No pertinent past medical history. History reviewed. No pertinent past surgical history. Family History  Problem Relation Age of Onset  . Stroke Father    Social History  Substance Use Topics  . Smoking status: Never Smoker   . Smokeless tobacco: Never Used  . Alcohol Use: No   OB History    Gravida Para Term Preterm AB TAB SAB Ectopic Multiple Living   Review of Systems  Constitutional: Positive for fatigue.  HENT: Negative for trouble swallowing.   Respiratory: Negative for shortness of breath.   Gastrointestinal: Negative for nausea, vomiting and abdominal pain.  Genitourinary:       Vaginal itching   Skin: Positive for rash.  Psychiatric/Behavioral: Negative for suicidal ideas. The patient is nervous/anxious.   All other systems reviewed and are negative.  Allergies  Latex  Home Medications   Prior to Admission medications   Medication Sig Start Date End Date Taking? Authorizing Provider  EPINEPHrine 0.3 mg/0.3 mL IJ SOAJ injection Inject into the muscle once.   Yes Historical Provider, MD  predniSONE (STERAPRED UNI-PAK 21 TAB) 10 MG (21) TBPK tablet Take 10 mg by mouth daily.   Yes Historical Provider, MD  dicyclomine (BENTYL) 20 MG tablet Take 1 tablet (20 mg total) by mouth 2 (two) times daily as needed for spasms. 09/24/14   Loren Racer, MD  docusate sodium (COLACE) 100 MG capsule Take 1 capsule (100 mg total) by mouth every 12 (twelve) hours. 09/24/14   Loren Racer, MD  HYDROcodone-acetaminophen (NORCO/VICODIN) 5-325 MG per tablet Take 2 tablets by mouth every 4 (four) hours as needed. 06/28/14   Rolland Porter, MD  hydrOXYzine (ATARAX/VISTARIL) 50 MG tablet Take 1 tablet (50 mg total) by mouth every 6 (six) hours as needed for anxiety or itching. 07/19/15   Shon Baton, MD  methocarbamol (ROBAXIN) 500 MG tablet Take 1 tablet (500 mg total) by mouth 2 (two) times daily. 06/28/14   Rolland Porter, MD  metroNIDAZOLE (FLAGYL) 500 MG tablet Take 1 tablet (500 mg  total) by mouth 2 (two) times daily. 05/12/15   Burgess Amor, PA-C   BP 137/98 mmHg  Pulse 76  Temp(Src) 98.2 F (36.8 C) (Oral)  Resp 16  Ht 4\' 11"  (1.499 m)  Wt 107 lb (48.535 kg)  BMI 21.60 kg/m2  SpO2 98% Physical Exam  Constitutional: She is oriented to person, place, and time. She appears well-developed and well-nourished.  Anxious appearing, no acute distress  HENT:  Head: Normocephalic and atraumatic.  Cardiovascular: Normal rate and regular rhythm.   Pulmonary/Chest: Effort normal. No respiratory distress.  Abdominal: Soft. Bowel sounds are normal.  Genitourinary:  Normal external vaginal exam, moderate  thick vaginal discharge noted, no cervical friability noted  Musculoskeletal: She exhibits no edema.  Neurological: She is alert and oriented to person, place, and time.  Skin: Skin is warm and dry. No rash noted.  Psychiatric: She has a normal mood and affect.  Nursing note and vitals reviewed.   ED Course  Procedures (including critical care time) DIAGNOSTIC STUDIES: Oxygen Saturation is 98% on RA, normal by my interpretation.    COORDINATION OF CARE: 12:16 AM-Discussed treatment plan which includes pelvic exam and UA with pt at bedside and pt agreed to plan.   Labs Review Labs Reviewed  WET PREP, GENITAL - Abnormal; Notable for the following:    Yeast Wet Prep HPF POC FEW (*)    Trich, Wet Prep FEW (*)    Clue Cells Wet Prep HPF POC FEW (*)    WBC, Wet Prep HPF POC MANY (*)    All other components within normal limits  URINALYSIS, ROUTINE W REFLEX MICROSCOPIC (NOT AT [facility]) - Abnormal; Notable for the following:    Hgb urine dipstick TRACE (*)    Leukocytes, UA SMALL (*)    All other components within normal limits  URINE MICROSCOPIC-ADD ON - Abnormal; Notable for the following:    Squamous Epithelial / LPF FEW (*)    Bacteria, UA MANY (*)    All other components within normal limits  PREGNANCY, URINE  GC/CHLAMYDIA PROBE AMP ([place]) NOT AT [facility]    Imaging Review No results found.    EKG Interpretation None      MDM   Final diagnoses:  Hives  Anxiety  Yeast infection  Trichomoniasis   Patient presents with multiple complaints. Patient reports hives over last 4-6 weeks that is good then recurrent. Currently on prednisone.  No current rash but states that she feels great anxiety regarding recurrence of her hives. Patient given Vistaril. Patient also reports symptoms of a yeast infection. Denies any new sexual partners or concerns for STDs. Pelvic exam with moderate vaginal discharge. Yeast and trichomonas on wet prep. GC Chlamydia pending. Patient given  metronidazole and Diflucan. Declines STD treatment at this time. Discussed with patient follow-up with PCP for allergy referral. Continue prednisone as previously prescribed. Vistaril when necessary for anxiety and itching.  After history, exam, and medical workup I feel the patient has been appropriately medically screened and is safe for discharge home. Pertinent diagnoses were discussed with the patient. Patient was given return precautions.  I personally performed the services described in this documentation, which was scribed in my presence. The recorded information has been reviewed and is accurate.     Shon Baton, MD 07/19/15 [phone]

## 2015-07-20 LAB — GC/CHLAMYDIA PROBE AMP (~~LOC~~) NOT AT ARMC
CHLAMYDIA, DNA PROBE: NEGATIVE
Neisseria Gonorrhea: NEGATIVE

## 2015-07-23 ENCOUNTER — Emergency Department (HOSPITAL_COMMUNITY)
Admission: EM | Admit: 2015-07-23 | Discharge: 2015-07-23 | Disposition: A | Discharge status: Home / Self Care | Payer: Medicaid Other | Attending: Emergency Medicine | Admitting: Emergency Medicine

## 2015-07-23 ENCOUNTER — Encounter (HOSPITAL_COMMUNITY): Payer: Self-pay | Admitting: Emergency Medicine

## 2015-07-23 DIAGNOSIS — Z91040 Latex allergy status: Secondary | ICD-10-CM | POA: Diagnosis not present

## 2015-07-23 DIAGNOSIS — R05 Cough: Secondary | ICD-10-CM | POA: Insufficient documentation

## 2015-07-23 DIAGNOSIS — R42 Dizziness and giddiness: Secondary | ICD-10-CM | POA: Diagnosis present

## 2015-07-23 DIAGNOSIS — M791 Myalgia, unspecified site: Secondary | ICD-10-CM

## 2015-07-23 DIAGNOSIS — Z872 Personal history of diseases of the skin and subcutaneous tissue: Secondary | ICD-10-CM | POA: Insufficient documentation

## 2015-07-23 DIAGNOSIS — Z7952 Long term (current) use of systemic steroids: Secondary | ICD-10-CM | POA: Diagnosis not present

## 2015-07-23 HISTORY — DX: Urticaria, unspecified: L50.9

## 2015-07-23 LAB — BASIC METABOLIC PANEL
Anion gap: 6 (ref 5–15)
BUN: 8 mg/dL (ref 6–20)
CHLORIDE: 107 mmol/L (ref 101–111)
CO2: 26 mmol/L (ref 22–32)
CREATININE: 0.5 mg/dL (ref 0.44–1.00)
Calcium: 8.6 mg/dL — ABNORMAL LOW (ref 8.9–10.3)
GFR calc Af Amer: >60 mL/min (ref >60)
GFR calc non Af Amer: >60 mL/min (ref >60)
Glucose, Bld: 91 mg/dL (ref 65–99)
Potassium: 4 mmol/L (ref 3.5–5.1)
Sodium: 139 mmol/L (ref 135–145)

## 2015-07-23 LAB — CK: Total CK: 44 U/L (ref 38–234)

## 2015-07-23 NOTE — ED Provider Notes (Signed)
CSN: [id number]     Arrival date & time 07/23/15  0347 History   First MD Initiated Contact with Patient 07/23/15 [phone]     Chief Complaint  Patient presents with  . Generalized Body Aches     The history is provided by the patient.  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 Hives    History reviewed. No pertinent past surgical history. Family History  Problem Relation Age of Onset  . Stroke Father    Social History  Substance Use Topics  . Smoking status: Never Smoker   . Smokeless tobacco: Never Used  . Alcohol Use: No   OB History    Gravida Para Term Preterm AB TAB SAB Ectopic Multiple Living   Review of Systems  Constitutional: Negative for fever.  Respiratory: Positive for cough.   Gastrointestinal: Negative for vomiting.  Musculoskeletal: Positive for myalgias.  Skin: Negative for rash.  Neurological: Negative for headaches.  All other systems reviewed and are negative.     Allergies  Latex  Home Medications   Prior to Admission medications   Medication Sig Start Date End Date Taking? Authorizing Provider  EPINEPHrine 0.3 mg/0.3 mL IJ SOAJ injection Inject into the muscle once.    Historical Provider, MD  hydrOXYzine (ATARAX/VISTARIL) 50 MG tablet Take 1 tablet (50 mg total) by mouth every 6 (six) hours as needed for anxiety or itching. 07/19/15   Shon Baton, MD  predniSONE (STERAPRED UNI-PAK 21 TAB) 10 MG (21) TBPK tablet Take 10 mg by mouth daily.    Historical Provider, MD   BP 113/72 mmHg  Pulse 84  Temp(Src) 97.6 F (36.4 C) (Oral)  Resp 18  Ht  (1.499 m)  Wt 107  lb (48.535 kg)  BMI 21.60 kg/m2  SpO2 100%  LMP 01/11/2015 Physical Exam CONSTITUTIONAL: Well developed/well nourished HEAD: Normocephalic/atraumatic EYES: EOMI/PERRL ENMT: Mucous membranes moist NECK: supple no meningeal signs SPINE/BACK:entire spine nontender CV: S1/S2 noted, no murmurs/rubs/gallops noted LUNGS: Lungs are clear to auscultation bilaterally, no apparent distress ABDOMEN: soft, nontender, no rebound or guarding, bowel sounds noted throughout abdomen NEURO: Pt is awake/alert/appropriate, moves all extremitiesx4.  No facial droop.   EXTREMITIES: pulses normal/equal, full ROM SKIN: warm, color normal, no rash noted PSYCH: no abnormalities of mood noted, alert and oriented to situation  ED Course  Procedures  Labs Review Labs Reviewed  BASIC METABOLIC PANEL - Abnormal; Notable for the following:    Calcium 8.6 (*)    All other components within normal limits  CK   I have personally reviewed and evaluated these  lab results as part of my medical decision-making.   Given recent prednisone use will check electrolytes/CK She had recent negative pregnancy test 9:48 AM Labs reassuring Advised PCP followup  MDM   Final diagnoses:  Myalgia    Nursing notes including past medical history and social history reviewed and considered in documentation Labs/vital reviewed myself and considered during evaluation     Zadie Rhine, MD 07/23/15 [phone]

## 2015-07-23 NOTE — ED Notes (Signed)
Patient c/o generalized bodyaches. Patient denies any fevers or insect bites. Per patient recently put on prednisone for hives of unknown origin. Per patient occasional dry cough.

## 2015-09-27 ENCOUNTER — Ambulatory Visit: Payer: Self-pay | Admitting: Allergy and Immunology

## 2015-10-12 ENCOUNTER — Ambulatory Visit: Payer: Self-pay | Admitting: Allergy and Immunology

## 2015-11-02 ENCOUNTER — Encounter (HOSPITAL_COMMUNITY): Payer: Self-pay

## 2015-11-02 ENCOUNTER — Emergency Department (HOSPITAL_COMMUNITY)
Admission: EM | Admit: 2015-11-02 | Discharge: 2015-11-02 | Disposition: A | Discharge status: Home / Self Care | Payer: Medicaid Other | Attending: Emergency Medicine | Admitting: Emergency Medicine

## 2015-11-02 DIAGNOSIS — Z7952 Long term (current) use of systemic steroids: Secondary | ICD-10-CM | POA: Insufficient documentation

## 2015-11-02 DIAGNOSIS — Z7951 Long term (current) use of inhaled steroids: Secondary | ICD-10-CM | POA: Insufficient documentation

## 2015-11-02 DIAGNOSIS — Z91040 Latex allergy status: Secondary | ICD-10-CM | POA: Diagnosis not present

## 2015-11-02 DIAGNOSIS — Z872 Personal history of diseases of the skin and subcutaneous tissue: Secondary | ICD-10-CM | POA: Insufficient documentation

## 2015-11-02 DIAGNOSIS — Z3202 Encounter for pregnancy test, result negative: Secondary | ICD-10-CM | POA: Insufficient documentation

## 2015-11-02 DIAGNOSIS — Z79899 Other long term (current) drug therapy: Secondary | ICD-10-CM | POA: Diagnosis not present

## 2015-11-02 DIAGNOSIS — N898 Other specified noninflammatory disorders of vagina: Secondary | ICD-10-CM | POA: Insufficient documentation

## 2015-11-02 LAB — POC URINE PREG, ED: PREG TEST UR: NEGATIVE

## 2015-11-02 LAB — WET PREP, GENITAL
Sperm: NONE SEEN
Trich, Wet Prep: NONE SEEN
Yeast Wet Prep HPF POC: NONE SEEN

## 2015-11-02 MED ORDER — METRONIDAZOLE 250 MG PO TABS: 250.0000 mg | ORAL_TABLET | Freq: Two times a day (BID) | ORAL | Status: DC

## 2015-11-02 NOTE — ED Notes (Signed)
While getting papers together, pt called her work, states she just needs a note and it is ok for her to be late. She will now stay and wait for results. PA made aware.

## 2015-11-02 NOTE — Discharge Instructions (Signed)
Please have your Gyn MD obtain your results and discuss these with you.

## 2015-11-02 NOTE — ED Notes (Signed)
Patient states that she has to be at work at 1030. Informed patient that her tests had not resulted yet, and we were waiting for results. Still stating she needs to leave. PA informed, discharge instructions printed and advised patient to have either PMD or GYN obtain her records.

## 2015-11-02 NOTE — ED Notes (Signed)
Unable to locate patient in room.

## 2015-11-02 NOTE — ED Notes (Signed)
Attempting to get urine specimen at this time.

## 2015-11-02 NOTE — ED Provider Notes (Signed)
CSN: [id number]     Arrival date & time 11/02/15  0818 History  By signing my name below, I, Jarvis Morganaylor Ferguson, attest that this documentation has been prepared under the direction and in the presence of Ivery QualeHobson Lillianna Sabel, PA-C Electronically Signed: Jarvis Morganaylor Ferguson, ED Scribe. 11/02/2015. 9:21 AM.      Chief Complaint  Patient presents with  . Vaginal Discharge    Patient is a 29 y.o. female presenting with vaginal discharge. The history is provided by the patient. No language interpreter was used.  Vaginal Discharge Quality:  Watery Severity:  Moderate Onset quality:  Gradual Duration:  2 days Timing:  Intermittent Progression:  Unchanged Chronicity:  New Relieved by:  Nothing Worsened by:  Nothing tried Ineffective treatments:  None tried Risk factors: unprotected sex   Risk factors: no new sexual partner     HPI Comments: Sylvia Day is a 29 y.o. female who presents to the Emergency Department complaining of increased, moderate, non odorous and non bloody, watery vaginal discharge onset 2 days. She reports she has been wearing panty liners due to the increase in discharge. Pt notes that wearing the panty liners caused some associated vaginal irritation. She denies any alleviating factors. Pt is sexually active and has been with the same partner for 1 year; her last sexual encounter was 3 days ago (1 day prior to onset of symptoms). Pt does not use condoms. [facility]. She denies any fever, chills, vaginal itching, dysuria or other associated symptoms.   Past Medical History  Diagnosis Date  . Hives    History reviewed. No pertinent past surgical history. Family History  Problem Relation Age of Onset  . Stroke Father    Social History  Substance Use Topics  . Smoking status: Never Smoker   . Smokeless tobacco: Never Used  . Alcohol Use: No   OB History    Gravida Para Term Preterm AB TAB SAB Ectopic Multiple Living   1 1 1       1      Review of  Systems  All other systems reviewed and are negative.     Allergies  Latex  Home Medications   Prior to Admission medications   Medication Sig Start Date End Date Taking? Authorizing Provider  cetirizine (ZYRTEC) 10 MG tablet Take 10 mg by mouth daily.   Yes Historical Provider, MD  mometasone (NASONEX) 50 MCG/ACT nasal spray Place 2 sprays into the nose daily.   Yes Historical Provider, MD  EPINEPHrine 0.3 mg/0.3 mL IJ SOAJ injection Inject into the muscle once.    Historical Provider, MD  hydrOXYzine (ATARAX/VISTARIL) 50 MG tablet Take 1 tablet (50 mg total) by mouth every 6 (six) hours as needed for anxiety or itching. 07/19/15   Shon Batonourtney F Horton, MD  predniSONE (STERAPRED UNI-PAK 21 TAB) 10 MG (21) TBPK tablet Take 10 mg by mouth daily.    Historical Provider, MD   Triage Vitals: BP 107/76 mmHg  Pulse 75  Temp(Src) 98.3 F (36.8 C) (Oral)  Resp 18  Ht 5' (1.524 m)  Wt 105 lb (47.628 kg)  BMI 20.51 kg/m2  SpO2 100%  Physical Exam  Constitutional: She is oriented to person, place, and time. She appears well-developed and well-nourished. No distress.  HENT:  Head: Normocephalic and atraumatic.  Eyes: Conjunctivae and EOM are normal.  Neck: Neck supple. No tracheal deviation present.  Cardiovascular: Normal rate.   Pulmonary/Chest: Effort normal. No respiratory distress.  Genitourinary:  Chaperone present during the exam. No fb in the vaginal canal.  Watery-mucus discharge noted  In the vaginal vault. No mass. No wall motion tenderness. No adnexal tenderness. Os of the cervix is closed. Mild to moderately friable. Scratch from the 2:00 to the 4:00 position. No external rash or abnormality.  Musculoskeletal: Normal range of motion.  Neurological: She is alert and oriented to person, place, and time.  Skin: Skin is warm and dry.  Psychiatric: She has a normal mood and affect. Her behavior is normal.  Nursing note and vitals reviewed.   ED Course  Procedures (including  critical care time) Labs Review Labs Reviewed  POC URINE PREG, ED    Imaging Review No results found. I have personally reviewed and evaluated these images and lab results as part of my medical decision-making.   EKG Interpretation None      MDM Vital signs stable. Wet prep and GC/Chlam sent to lab. Pt stated she had to leave before result returned. Pt to have her Gyn MD obtain result and discuss them with pt.   Final diagnoses:  Vaginal discharge    *I have reviewed nursing notes, vital signs, and all appropriate lab and imaging results for this patient.** *I personally performed the services described in this documentation, which was scribed in my presence. The recorded information has been reviewed and is accurate.    Ivery Quale, PA-C 11/02/15 1024  Benjiman Core, MD 11/02/15 [phone]

## 2015-11-02 NOTE — ED Notes (Signed)
Pt reports had unprotected sex Sunday and noticed a watery vaginal discharge since Monday.  Denies any odor, itching, or burning.  Reports has had same sexual partner for " a while."

## 2015-11-03 LAB — GC/CHLAMYDIA PROBE AMP (~~LOC~~) NOT AT ARMC
CHLAMYDIA, DNA PROBE: NEGATIVE
NEISSERIA GONORRHEA: NEGATIVE

## 2015-12-20 ENCOUNTER — Emergency Department (HOSPITAL_COMMUNITY)
Admission: EM | Admit: 2015-12-20 | Discharge: 2015-12-20 | Disposition: A | Discharge status: Home / Self Care | Payer: Medicaid Other | Attending: Emergency Medicine | Admitting: Emergency Medicine

## 2015-12-20 DIAGNOSIS — Z79899 Other long term (current) drug therapy: Secondary | ICD-10-CM | POA: Insufficient documentation

## 2015-12-20 DIAGNOSIS — N939 Abnormal uterine and vaginal bleeding, unspecified: Secondary | ICD-10-CM | POA: Diagnosis present

## 2015-12-20 DIAGNOSIS — N926 Irregular menstruation, unspecified: Secondary | ICD-10-CM | POA: Insufficient documentation

## 2015-12-20 DIAGNOSIS — Z7951 Long term (current) use of inhaled steroids: Secondary | ICD-10-CM | POA: Insufficient documentation

## 2015-12-20 DIAGNOSIS — Z91040 Latex allergy status: Secondary | ICD-10-CM | POA: Diagnosis not present

## 2015-12-20 DIAGNOSIS — Z872 Personal history of diseases of the skin and subcutaneous tissue: Secondary | ICD-10-CM | POA: Insufficient documentation

## 2015-12-20 LAB — I-STAT CHEM 8, ED
BUN: 9 mg/dL (ref 6–20)
CREATININE: 0.6 mg/dL (ref 0.44–1.00)
Calcium, Ion: 1.23 mmol/L (ref 1.12–1.23)
Chloride: 106 mmol/L (ref 101–111)
GLUCOSE: 89 mg/dL (ref 65–99)
HEMATOCRIT: 42 % (ref 36.0–46.0)
HEMOGLOBIN: 14.3 g/dL (ref 12.0–15.0)
Potassium: 4.5 mmol/L (ref 3.5–5.1)
Sodium: 144 mmol/L (ref 135–145)
TCO2: 27 mmol/L (ref 0–100)

## 2015-12-20 LAB — I-STAT BETA HCG BLOOD, ED (MC, WL, AP ONLY): I-stat hCG, quantitative: <5 m[IU]/mL (ref <5)

## 2015-12-20 NOTE — ED Notes (Signed)
MD Zammit at bedside updating patient. 

## 2015-12-20 NOTE — Discharge Instructions (Signed)
Follow up with your family md or dr. Emelda Fear next week.

## 2015-12-20 NOTE — ED Notes (Signed)
Patient with no complaints at this time. Respirations even and unlabored. Skin warm/dry. Discharge instructions reviewed with patient at this time. Patient given opportunity to voice concerns/ask questions. Patient discharged at this time and left Emergency Department with steady gait.   

## 2015-12-20 NOTE — ED Provider Notes (Signed)
CSN: [id number]     Arrival date & time 12/20/15  1236 History   First MD Initiated Contact with Patient 12/20/15 1346     Chief Complaint  Patient presents with  . Vaginal Bleeding     (Consider location/radiation/quality/duration/timing/severity/associated sxs/prior Treatment) Patient is a 30 y.o. female presenting with vaginal bleeding. The history is provided by the patient (The patient complains of irregular menses for a month. No pain).  Vaginal Bleeding Quality: Patient having spotting. Severity:  Mild Onset quality:  Sudden Timing:  Constant Progression:  Waxing and waning Chronicity:  New Menstrual history:  Irregular Possible pregnancy: yes   Associated symptoms: no abdominal pain, no back pain and no fatigue     Past Medical History  Diagnosis Date  . Hives    No past surgical history on file. Family History  Problem Relation Age of Onset  . Stroke Father    Social History  Substance Use Topics  . Smoking status: Never Smoker   . Smokeless tobacco: Never Used  . Alcohol Use: No   OB History    Gravida Para Term Preterm AB TAB SAB Ectopic Multiple Living   Review of Systems  Constitutional: Negative for appetite change and fatigue.  HENT: Negative for congestion, ear discharge and sinus pressure.   Eyes: Negative for discharge.  Respiratory: Negative for cough.   Cardiovascular: Negative for chest pain.  Gastrointestinal: Negative for abdominal pain and diarrhea.  Genitourinary: Positive for vaginal bleeding. Negative for frequency and hematuria.  Musculoskeletal: Negative for back pain.  Skin: Negative for rash.  Neurological: Negative for seizures and headaches.  Psychiatric/Behavioral: Negative for hallucinations.      Allergies  Latex  Home Medications   Prior to Admission medications   Medication Sig Start Date End Date Taking? Authorizing Provider  cetirizine (ZYRTEC) 10 MG tablet Take 10 mg by mouth daily.   Yes  Historical Provider, MD  EPINEPHrine 0.3 mg/0.3 mL IJ SOAJ injection Inject into the muscle once.   Yes Historical Provider, MD  mometasone (NASONEX) 50 MCG/ACT nasal spray Place 2 sprays into the nose daily.   Yes Historical Provider, MD  hydrOXYzine (ATARAX/VISTARIL) 50 MG tablet Take 1 tablet (50 mg total) by mouth every 6 (six) hours as needed for anxiety or itching. Patient not taking: Reported on 12/20/2015 07/19/15   Shon Baton, MD  metroNIDAZOLE (FLAGYL) 250 MG tablet Take 1 tablet (250 mg total) by mouth 2 (two) times daily. Patient not taking: Reported on 12/20/2015 11/02/15   Ivery Quale, PA-C   BP 100/68 mmHg  Pulse 87  Temp(Src) 97.8 F (36.6 C) (Oral)  Resp 16  Ht  (1.473 m)  Wt 108 lb (48.988 kg)  BMI 22.58 kg/m2  SpO2 97% Physical Exam  Constitutional: She is oriented to person, place, and time. She appears well-developed.  HENT:  Head: Normocephalic.  Eyes: Conjunctivae and EOM are normal. No scleral icterus.  Neck: Neck supple. No thyromegaly present.  Cardiovascular: Normal rate and regular rhythm.  Exam reveals no gallop and no friction rub.   No murmur heard. Pulmonary/Chest: No stridor. She has no wheezes. She has no rales. She exhibits no tenderness.  Abdominal: She exhibits no distension. There is no tenderness. There is no rebound.  Genitourinary:  Speculum  Exam shows mild amount of blood. Bimanual exam normal  Musculoskeletal: Normal range of motion. She exhibits no edema.  Lymphadenopathy:  She has no cervical adenopathy.  Neurological: She is oriented to person, place, and time. She exhibits normal muscle tone. Coordination normal.  Skin: No rash noted. No erythema.  Psychiatric: She has a normal mood and affect. Her behavior is normal.    ED Course  Procedures (including critical care time) Labs Review Labs Reviewed  I-STAT CHEM 8, ED  I-STAT BETA HCG BLOOD, ED ([facility], [facility], [facility] ONLY)    Imaging Review No results found. I have  personally reviewed and evaluated these images and lab results as part of my medical decision-making.   EKG Interpretation None      MDM   Final diagnoses:  Irregular menses    Irregular menses. Patient will be referred back to her PCP  The chart was scribed for me under my direct supervision.  I personally performed the history, physical, and medical decision making and all procedures in the evaluation of this patient.Bethann Berkshire, MD 12/20/15 [phone]

## 2015-12-20 NOTE — ED Notes (Signed)
Patient reports vaginal bleeding/spotting x 3 weeks. Reports irregular periods x 2 years also, last regular period of December of 2015. No current birth control.

## 2015-12-30 ENCOUNTER — Other Ambulatory Visit: Payer: Self-pay | Admitting: Allergy and Immunology

## 2015-12-30 MED ORDER — CETIRIZINE HCL 10 MG PO TABS: ORAL_TABLET | ORAL | Status: DC

## 2016-02-09 ENCOUNTER — Other Ambulatory Visit: Payer: Self-pay | Admitting: Allergy and Immunology

## 2016-05-21 ENCOUNTER — Encounter (HOSPITAL_COMMUNITY): Payer: Self-pay | Admitting: *Deleted

## 2016-05-21 ENCOUNTER — Emergency Department (HOSPITAL_COMMUNITY)
Admission: EM | Admit: 2016-05-21 | Discharge: 2016-05-21 | Disposition: A | Discharge status: Home / Self Care | Payer: Medicaid Other | Attending: Emergency Medicine | Admitting: Emergency Medicine

## 2016-05-21 DIAGNOSIS — M25531 Pain in right wrist: Secondary | ICD-10-CM | POA: Diagnosis present

## 2016-05-21 DIAGNOSIS — M778 Other enthesopathies, not elsewhere classified: Secondary | ICD-10-CM | POA: Diagnosis not present

## 2016-05-21 MED ORDER — NAPROXEN 500 MG PO TABS: 500.0000 mg | ORAL_TABLET | Freq: Two times a day (BID) | ORAL | Status: DC

## 2016-05-21 MED ORDER — TRAMADOL HCL 50 MG PO TABS: 50.0000 mg | ORAL_TABLET | Freq: Four times a day (QID) | ORAL | Status: DC | PRN

## 2016-05-21 NOTE — ED Notes (Signed)
Pt states she was lifting tires and may have injured her right wrist; pt states she as been taking ibuprofen with no relief; pt states the pain woke her up tonight

## 2016-05-21 NOTE — ED Notes (Signed)
Pt states wrist pain, denies injury, says cant sleep dur to the pain.

## 2016-05-21 NOTE — Discharge Instructions (Signed)
Apply ice several times a day. Keep her hand elevated. Wear the splint as needed.  Tendinitis Tendinitis is swelling and inflammation of the tendons. Tendons are band-like tissues that connect muscle to bone. Tendinitis commonly occurs in the:   Shoulders (rotator cuff).  Heels (Achilles tendon).  Elbows (triceps tendon). CAUSES Tendinitis is usually caused by overusing the tendon, muscles, and joints involved. When the tissue surrounding a tendon (synovium) becomes inflamed, it is called tenosynovitis. Tendinitis commonly develops in people whose jobs require repetitive motions. SYMPTOMS  Pain.  Tenderness.  Mild swelling. DIAGNOSIS Tendinitis is usually diagnosed by physical exam. Your health care provider may also order X-rays or other imaging tests. TREATMENT Your health care provider may recommend certain medicines or exercises for your treatment. HOME CARE INSTRUCTIONS   Use a sling or splint for as long as directed by your health care provider until the pain decreases.  Put ice on the injured area.  Put ice in a plastic bag.  Place a towel between your skin and the bag.  Leave the ice on for 15-20 minutes, 3-4 times a day, or as directed by your health care provider.  Avoid using the limb while the tendon is painful. Perform gentle range of motion exercises only as directed by your health care provider. Stop exercises if pain or discomfort increase, unless directed otherwise by your health care provider.  Only take over-the-counter or prescription medicines for pain, discomfort, or fever as directed by your health care provider. SEEK MEDICAL CARE IF:   Your pain and swelling increase.  You develop new, unexplained symptoms, especially increased numbness in the hands. MAKE SURE YOU:   Understand these instructions.  Will watch your condition.  Will get help right away if you are not doing well or get worse.   This information is not intended to replace advice  given to you by your health care provider. Make sure you discuss any questions you have with your health care provider.   Document Released: 11/09/2000 Document Revised: 12/03/2014 Document Reviewed: 01/29/2011 Elsevier Interactive Patient Education 2016 Elsevier Inc.  Naproxen and naproxen sodium oral immediate-release tablets What is this medicine? NAPROXEN (na PROX en) is a non-steroidal anti-inflammatory drug (NSAID). It is used to reduce swelling and to treat pain. This medicine may be used for dental pain, headache, or painful monthly periods. It is also used for painful joint and muscular problems such as arthritis, tendinitis, bursitis, and gout. This medicine may be used for other purposes; ask your health care provider or pharmacist if you have questions. What should I tell my health care provider before I take this medicine? They need to know if you have any of these conditions: -asthma -cigarette smoker -drink more than 3 alcohol containing drinks a day -heart disease or circulation problems such as heart failure or leg edema (fluid retention) -high blood pressure -kidney disease -liver disease -stomach bleeding or ulcers -an unusual or allergic reaction to naproxen, aspirin, other NSAIDs, other medicines, foods, dyes, or preservatives -pregnant or trying to get pregnant -breast-feeding How should I use this medicine? Take this medicine by mouth with a glass of water. Follow the directions on the prescription label. Take it with food if your stomach gets upset. Try to not lie down for at least 10 minutes after you take it. Take your medicine at regular intervals. Do not take your medicine more often than directed. Long-term, continuous use may increase the risk of heart attack or stroke. A special MedGuide will be  given to you by the pharmacist with each prescription and refill. Be sure to read this information carefully each time. Talk to your pediatrician regarding the use of  this medicine in children. Special care may be needed. Overdosage: If you think you have taken too much of this medicine contact a poison control center or emergency room at once. NOTE: This medicine is only for you. Do not share this medicine with others. What if I miss a dose? If you miss a dose, take it as soon as you can. If it is almost time for your next dose, take only that dose. Do not take double or extra doses. What may interact with this medicine? -alcohol -aspirin -cidofovir -diuretics -lithium -methotrexate -other drugs for inflammation like ketorolac or prednisone -pemetrexed -probenecid -warfarin This list may not describe all possible interactions. Give your health care provider a list of all the medicines, herbs, non-prescription drugs, or dietary supplements you use. Also tell them if you smoke, drink alcohol, or use illegal drugs. Some items may interact with your medicine. What should I watch for while using this medicine? Tell your doctor or health care professional if your pain does not get better. Talk to your doctor before taking another medicine for pain. Do not treat yourself. This medicine does not prevent heart attack or stroke. In fact, this medicine may increase the chance of a heart attack or stroke. The chance may increase with longer use of this medicine and in people who have heart disease. If you take aspirin to prevent heart attack or stroke, talk with your doctor or health care professional. Do not take other medicines that contain aspirin, ibuprofen, or naproxen with this medicine. Side effects such as stomach upset, nausea, or ulcers may be more likely to occur. Many medicines available without a prescription should not be taken with this medicine. This medicine can cause ulcers and bleeding in the stomach and intestines at any time during treatment. Do not smoke cigarettes or drink alcohol. These increase irritation to your stomach and can make it more  susceptible to damage from this medicine. Ulcers and bleeding can happen without warning symptoms and can cause death. You may get drowsy or dizzy. Do not drive, use machinery, or do anything that needs mental alertness until you know how this medicine affects you. Do not stand or sit up quickly, especially if you are an older patient. This reduces the risk of dizzy or fainting spells. This medicine can cause you to bleed more easily. Try to avoid damage to your teeth and gums when you brush or floss your teeth. What side effects may I notice from receiving this medicine? Side effects that you should report to your doctor or health care professional as soon as possible: -black or bloody stools, blood in the urine or vomit -blurred vision -chest pain -difficulty breathing or wheezing -nausea or vomiting -severe stomach pain -skin rash, skin redness, blistering or peeling skin, hives, or itching -slurred speech or weakness on one side of the body -swelling of eyelids, throat, lips -unexplained weight gain or swelling -unusually weak or tired -yellowing of eyes or skin Side effects that usually do not require medical attention (report to your doctor or health care professional if they continue or are bothersome): -constipation -headache -heartburn This list may not describe all possible side effects. Call your doctor for medical advice about side effects. You may report side effects to FDA at 1-800-FDA-1088. Where should I keep my medicine? Keep out of the  reach of children. Store at room temperature between 15 and 30 degrees C (59 and 86 degrees F). Keep container tightly closed. Throw away any unused medicine after the expiration date. NOTE: This sheet is a summary. It may not cover all possible information. If you have questions about this medicine, talk to your doctor, pharmacist, or health care provider.    2016, Elsevier/Gold Standard. (2009-11-14 20:10:16)  Tramadol tablets What is  this medicine? TRAMADOL (TRA ma dole) is a pain reliever. It is used to treat moderate to severe pain in adults. This medicine may be used for other purposes; ask your health care provider or pharmacist if you have questions. What should I tell my health care provider before I take this medicine? They need to know if you have any of these conditions: -brain tumor -depression -drug abuse or addiction -head injury -if you frequently drink alcohol containing drinks -kidney disease or trouble passing urine -liver disease -lung disease, asthma, or breathing problems -seizures or epilepsy -suicidal thoughts, plans, or attempt; a previous suicide attempt by you or a family member -an unusual or allergic reaction to tramadol, codeine, other medicines, foods, dyes, or preservatives -pregnant or trying to get pregnant -breast-feeding How should I use this medicine? Take this medicine by mouth with a full glass of water. Follow the directions on the prescription label. If the medicine upsets your stomach, take it with food or milk. Do not take more medicine than you are told to take. Talk to your pediatrician regarding the use of this medicine in children. Special care may be needed. Overdosage: If you think you have taken too much of this medicine contact a poison control center or emergency room at once. NOTE: This medicine is only for you. Do not share this medicine with others. What if I miss a dose? If you miss a dose, take it as soon as you can. If it is almost time for your next dose, take only that dose. Do not take double or extra doses. What may interact with this medicine? Do not take this medicine with any of the following medications: -MAOIs like Carbex, Eldepryl, Marplan, Nardil, and Parnate This medicine may also interact with the following medications: -alcohol or medicines that contain alcohol -antihistamines -benzodiazepines -bupropion -carbamazepine or  oxcarbazepine -clozapine -cyclobenzaprine -digoxin -furazolidone -linezolid -medicines for depression, anxiety, or psychotic disturbances -medicines for migraine headache like almotriptan, eletriptan, frovatriptan, naratriptan, rizatriptan, sumatriptan, zolmitriptan -medicines for pain like pentazocine, buprenorphine, butorphanol, meperidine, nalbuphine, and propoxyphene -medicines for sleep -muscle relaxants -naltrexone -phenobarbital -phenothiazines like perphenazine, thioridazine, chlorpromazine, mesoridazine, fluphenazine, prochlorperazine, promazine, and trifluoperazine -procarbazine -warfarin This list may not describe all possible interactions. Give your health care provider a list of all the medicines, herbs, non-prescription drugs, or dietary supplements you use. Also tell them if you smoke, drink alcohol, or use illegal drugs. Some items may interact with your medicine. What should I watch for while using this medicine? Tell your doctor or health care professional if your pain does not go away, if it gets worse, or if you have new or a different type of pain. You may develop tolerance to the medicine. Tolerance means that you will need a higher dose of the medicine for pain relief. Tolerance is normal and is expected if you take this medicine for a long time. Do not suddenly stop taking your medicine because you may develop a severe reaction. Your body becomes used to the medicine. This does NOT mean you are addicted. Addiction is a behavior  related to getting and using a drug for a non-medical reason. If you have pain, you have a medical reason to take pain medicine. Your doctor will tell you how much medicine to take. If your doctor wants you to stop the medicine, the dose will be slowly lowered over time to avoid any side effects. You may get drowsy or dizzy. Do not drive, use machinery, or do anything that needs mental alertness until you know how this medicine affects you. Do not  stand or sit up quickly, especially if you are an older patient. This reduces the risk of dizzy or fainting spells. Alcohol can increase or decrease the effects of this medicine. Avoid alcoholic drinks. You may have constipation. Try to have a bowel movement at least every 2 to 3 days. If you do not have a bowel movement for 3 days, call your doctor or health care professional. Your mouth may get dry. Chewing sugarless gum or sucking hard candy, and drinking plenty of water may help. Contact your doctor if the problem does not go away or is severe. What side effects may I notice from receiving this medicine? Side effects that you should report to your doctor or health care professional as soon as possible: -allergic reactions like skin rash, itching or hives, swelling of the face, lips, or tongue -breathing difficulties, wheezing -confusion -itching -light headedness or fainting spells -redness, blistering, peeling or loosening of the skin, including inside the mouth -seizures Side effects that usually do not require medical attention (report to your doctor or health care professional if they continue or are bothersome): -constipation -dizziness -drowsiness -headache -nausea, vomiting This list may not describe all possible side effects. Call your doctor for medical advice about side effects. You may report side effects to FDA at 1-800-FDA-1088. Where should I keep my medicine? Keep out of the reach of children. This medicine may cause accidental overdose and death if it taken by other adults, children, or pets. Mix any unused medicine with a substance like cat litter or coffee grounds. Then throw the medicine away in a sealed container like a sealed bag or a coffee can with a lid. Do not use the medicine after the expiration date. Store at room temperature between 15 and 30 degrees C (59 and 86 degrees F). NOTE: This sheet is a summary. It may not cover all possible information. If you have  questions about this medicine, talk to your doctor, pharmacist, or health care provider.    2016, Elsevier/Gold Standard. (2014-01-08 15:42:09)

## 2016-05-21 NOTE — ED Provider Notes (Signed)
CSN: [id number]     Arrival date & time 05/21/16  0448 History   First MD Initiated Contact with Patient 05/21/16 0602     Chief Complaint  Patient presents with  . Wrist Pain     (Consider location/radiation/quality/duration/timing/severity/associated sxs/prior Treatment) Patient is a 30 y.o. female presenting with wrist pain. The history is provided by the patient.  Wrist Pain  She is complaining of pain in the dorsal radial aspect of her right wrist and hand for the last few days. Pain started after she had carried a set of automobile tires up a hill. She rates pain at 9/10. She was unable to sleep because of pain. Pain is worse with any movement of the hand. Nothing makes it better. She tried applying icy hot with slight, temporary relief. She denies any other injury or unusual activity.  Past Medical History  Diagnosis Date  . Hives    History reviewed. No pertinent past surgical history. Family History  Problem Relation Age of Onset  . Stroke Father    Social History  Substance Use Topics  . Smoking status: Never Smoker   . Smokeless tobacco: Never Used  . Alcohol Use: No   OB History    Gravida Para Term Preterm AB TAB SAB Ectopic Multiple Living   1 1 1       1      Review of Systems  All other systems reviewed and are negative.     Allergies  Latex  Home Medications   Prior to Admission medications   Medication Sig Start Date End Date Taking? Authorizing Provider  cetirizine (ZYRTEC) 10 MG tablet Take one daily for runny nose or drainage. 12/30/15   Cristal Fordalph Carter Bobbitt, MD  EPINEPHrine 0.3 mg/0.3 mL IJ SOAJ injection Inject into the muscle once.    Historical Provider, MD  mometasone (NASONEX) 50 MCG/ACT nasal spray Place 2 sprays into the nose daily.    Historical Provider, MD  naproxen (NAPROSYN) 500 MG tablet Take 1 tablet (500 mg total) by mouth 2 (two) times daily. 05/21/16   Dione Boozeavid Jacora Hopkins, MD  traMADol (ULTRAM) 50 MG tablet Take 1 tablet (50 mg total) by  mouth every 6 (six) hours as needed. 05/21/16   Dione Boozeavid Skyelyn Scruggs, MD   BP 104/70 mmHg  Pulse 77  Temp(Src) 98.1 F (36.7 C) (Oral)  Resp 18  Ht 4\' 11"  (1.499 m)  Wt 108 lb (48.988 kg)  BMI 21.80 kg/m2  SpO2 100%  LMP 04/10/2016 Physical Exam  Nursing note and vitals reviewed.  30 year old female, resting comfortably and in no acute distress. Vital signs are normal. Oxygen saturation is 100%, which is normal. Head is normocephalic and atraumatic. PERRLA, EOMI. Oropharynx is clear. Neck is nontender and supple without adenopathy or JVD. Back is nontender and there is no CVA tenderness. Lungs are clear without rales, wheezes, or rhonchi. Chest is nontender. Heart has regular rate and rhythm without murmur. Abdomen is soft, flat, nontender without masses or hepatosplenomegaly and peristalsis is normoactive. Extremities have no cyanosis or edema. There is no swelling noted of the right hand or wrist. There is tenderness to palpation over the dorsal radial aspect of the right hand and wrist. Pain is elicited by passive flexion of the right thumb and index finger, and with extension of right thumb and index finger against resistance. Picture is consistent with extensor tendinitis. Skin is warm and dry without rash. Neurologic: Mental status is normal, cranial nerves are intact, there are no motor  or sensory deficits.  ED Course  Procedures (including critical care time)   MDM   Final diagnoses:  Tendonitis of wrist, right    Extensor tendinitis of the right hand and wrist. She is placed in the cockup wrist splint for comfort and is discharged with prescriptions for naproxen and tramadol. Follow-up with PCP.    Dione Boozeavid Jerome Otter, MD 05/21/16 [phone]

## 2016-05-21 NOTE — ED Notes (Signed)
Pt alert & oriented x4, stable gait. Patient given discharge instructions, paperwork & prescription(s). Patient  instructed to stop at the registration desk to finish any additional paperwork. Patient verbalized understanding. Pt left department w/ no further questions. 

## 2016-05-30 ENCOUNTER — Emergency Department (HOSPITAL_COMMUNITY): Payer: Medicaid Other

## 2016-05-30 ENCOUNTER — Emergency Department (HOSPITAL_COMMUNITY)
Admission: EM | Admit: 2016-05-30 | Discharge: 2016-05-30 | Disposition: A | Discharge status: Home / Self Care | Payer: Medicaid Other | Attending: Emergency Medicine | Admitting: Emergency Medicine

## 2016-05-30 ENCOUNTER — Encounter (HOSPITAL_COMMUNITY): Payer: Self-pay | Admitting: Emergency Medicine

## 2016-05-30 DIAGNOSIS — R102 Pelvic and perineal pain: Secondary | ICD-10-CM | POA: Diagnosis not present

## 2016-05-30 DIAGNOSIS — O26899 Other specified pregnancy related conditions, unspecified trimester: Secondary | ICD-10-CM

## 2016-05-30 DIAGNOSIS — H40009 Preglaucoma, unspecified, unspecified eye: Secondary | ICD-10-CM

## 2016-05-30 DIAGNOSIS — Z3A08 8 weeks gestation of pregnancy: Secondary | ICD-10-CM | POA: Insufficient documentation

## 2016-05-30 DIAGNOSIS — O26891 Other specified pregnancy related conditions, first trimester: Secondary | ICD-10-CM | POA: Insufficient documentation

## 2016-05-30 DIAGNOSIS — R51 Headache: Secondary | ICD-10-CM | POA: Diagnosis not present

## 2016-05-30 DIAGNOSIS — Z3490 Encounter for supervision of normal pregnancy, unspecified, unspecified trimester: Secondary | ICD-10-CM

## 2016-05-30 LAB — BASIC METABOLIC PANEL
Anion gap: 5 (ref 5–15)
BUN: 9 mg/dL (ref 6–20)
CHLORIDE: 107 mmol/L (ref 101–111)
CO2: 22 mmol/L (ref 22–32)
CREATININE: 0.43 mg/dL — AB (ref 0.44–1.00)
Calcium: 8.9 mg/dL (ref 8.9–10.3)
GFR calc Af Amer: >60 mL/min (ref >60)
GFR calc non Af Amer: >60 mL/min (ref >60)
GLUCOSE: 87 mg/dL (ref 65–99)
POTASSIUM: 3.7 mmol/L (ref 3.5–5.1)
Sodium: 134 mmol/L — ABNORMAL LOW (ref 135–145)

## 2016-05-30 LAB — URINALYSIS, ROUTINE W REFLEX MICROSCOPIC
Bilirubin Urine: NEGATIVE
GLUCOSE, UA: NEGATIVE mg/dL
Ketones, ur: NEGATIVE mg/dL
LEUKOCYTES UA: NEGATIVE
Nitrite: NEGATIVE
PH: 6.5 (ref 5.0–8.0)
Specific Gravity, Urine: 1.025 (ref 1.005–1.030)

## 2016-05-30 LAB — CBC WITH DIFFERENTIAL/PLATELET
Basophils Absolute: 0 10*3/uL (ref 0.0–0.1)
Basophils Relative: 0 %
Eosinophils Absolute: 0.1 10*3/uL (ref 0.0–0.7)
Eosinophils Relative: 1 %
HEMATOCRIT: 40.7 % (ref 36.0–46.0)
HEMOGLOBIN: 14.2 g/dL (ref 12.0–15.0)
LYMPHS ABS: 2.5 10*3/uL (ref 0.7–4.0)
LYMPHS PCT: 31 %
MCH: 30.7 pg (ref 26.0–34.0)
MCHC: 34.9 g/dL (ref 30.0–36.0)
MCV: 87.9 fL (ref 78.0–100.0)
MONOS PCT: 8 %
Monocytes Absolute: 0.7 10*3/uL (ref 0.1–1.0)
NEUTROS PCT: 60 %
Neutro Abs: 4.9 10*3/uL (ref 1.7–7.7)
Platelets: 388 10*3/uL (ref 150–400)
RBC: 4.63 MIL/uL (ref 3.87–5.11)
RDW: 12.6 % (ref 11.5–15.5)
WBC: 8.2 10*3/uL (ref 4.0–10.5)

## 2016-05-30 LAB — HCG, QUANTITATIVE, PREGNANCY: hCG, Beta Chain, Quant, S: 351 m[IU]/mL — ABNORMAL HIGH (ref <5)

## 2016-05-30 LAB — PREGNANCY, URINE: Preg Test, Ur: POSITIVE — AB

## 2016-05-30 LAB — URINE MICROSCOPIC-ADD ON

## 2016-05-30 NOTE — ED Notes (Signed)
Patient verbalizes understanding of discharge instructions, home care and follow up care. Patient out of department at this time. 

## 2016-05-30 NOTE — Discharge Instructions (Signed)
We have not been able to rule out ectopic pregnancy. Follow-up in 2-3 days for a recheck of the pregnancy test level. Follow-up with Dr. Emelda FearFerguson. If pain increases or lightheadedness dizziness or bleeding follow-up with Dr. Rayna SextonFerguson's office sooner or with [facility].   Abdominal Pain During Pregnancy Abdominal pain is common in pregnancy. Most of the time, it does not cause harm. There are many causes of abdominal pain. Some causes are more serious than others. Some of the causes of abdominal pain in pregnancy are easily diagnosed. Occasionally, the diagnosis takes time to understand. Other times, the cause is not determined. Abdominal pain can be a sign that something is very wrong with the pregnancy, or the pain may have nothing to do with the pregnancy at all. For this reason, always tell your health care provider if you have any abdominal discomfort. HOME CARE INSTRUCTIONS  Monitor your abdominal pain for any changes. The following actions may help to alleviate any discomfort you are experiencing:  Do not have sexual intercourse or put anything in your vagina until your symptoms go away completely.  Get plenty of rest until your pain improves.  Drink clear fluids if you feel nauseous. Avoid solid food as long as you are uncomfortable or nauseous.  Only take over-the-counter or prescription medicine as directed by your health care provider.  Keep all follow-up appointments with your health care provider. SEEK IMMEDIATE MEDICAL CARE IF:  You are bleeding, leaking fluid, or passing tissue from the vagina.  You have increasing pain or cramping.  You have persistent vomiting.  You have painful or bloody urination.  You have a fever.  You notice a decrease in your baby's movements.  You have extreme weakness or feel faint.  You have shortness of breath, with or without abdominal pain.  You develop a severe headache with abdominal pain.  You have abnormal vaginal discharge  with abdominal pain.  You have persistent diarrhea.  You have abdominal pain that continues even after rest, or gets worse. MAKE SURE YOU:   Understand these instructions.  Will watch your condition.  Will get help right away if you are not doing well or get worse.   This information is not intended to replace advice given to you by your health care provider. Make sure you discuss any questions you have with your health care provider.   Document Released: 11/12/2005 Document Revised: 09/02/2013 Document Reviewed: 06/11/2013 Elsevier Interactive Patient Education [employer]2016 [employer].

## 2016-05-30 NOTE — ED Notes (Signed)
Patient states she took home pregnancy test 2 days ago and it was positive. States last menstrual period was May 16. Complaining of abdominal cramping since June 16, worsening the last 3-4 days. Denies vaginal bleeding at this time.

## 2016-05-30 NOTE — ED Provider Notes (Signed)
CSN: [id number]     Arrival date & time 05/30/16  1338 History   First MD Initiated Contact with Patient 05/30/16 1624     Chief Complaint  Patient presents with  . Abdominal Pain      Patient is a 30 y.o. female presenting with abdominal pain. The history is provided by the patient.  Abdominal Pain Associated symptoms: nausea   Associated symptoms: no chest pain, no shortness of breath and no vaginal discharge   Patient presents with abdominal pain. She has had left lower abdominal pain with some cramping for the last couple days. States she had a positive primary test home. No dysuria. No real change in her vaginal discharge. No lightheadedness or dizziness. States she has been nauseous. Pain is dull and crampy. No change in bowel habits. Last menses was around 6 weeks ago. She has been pregnant once and has an 31-year-old child at home.  Past Medical History  Diagnosis Date  . Hives    History reviewed. No pertinent past surgical history. Family History  Problem Relation Age of Onset  . Stroke Father    Social History  Substance Use Topics  . Smoking status: Never Smoker   . Smokeless tobacco: Never Used  . Alcohol Use: Yes     Comment: occasionally   OB History    Gravida Para Term Preterm AB TAB SAB Ectopic Multiple Living   1 1 1       1      Review of Systems  Constitutional: Negative for appetite change.  HENT: Negative for sinus pressure.   Eyes: Negative for photophobia.  Respiratory: Negative for shortness of breath.   Cardiovascular: Negative for chest pain.  Gastrointestinal: Positive for nausea and abdominal pain.  Endocrine: Negative for polydipsia.  Genitourinary: Positive for pelvic pain. Negative for flank pain, vaginal discharge and vaginal pain.  Musculoskeletal: Negative for myalgias and back pain.  Skin: Negative for wound.  Neurological: Positive for headaches.  Hematological: Negative for adenopathy.      Allergies  Latex  Home Medications    Prior to Admission medications   Medication Sig Start Date End Date Taking? Authorizing Provider  cetirizine (ZYRTEC) 10 MG tablet Take one daily for runny nose or drainage. 12/30/15   Cristal Fordalph Carter Bobbitt, MD  EPINEPHrine 0.3 mg/0.3 mL IJ SOAJ injection Inject into the muscle once.    Historical Provider, MD  mometasone (NASONEX) 50 MCG/ACT nasal spray Place 2 sprays into the nose daily.    Historical Provider, MD  naproxen (NAPROSYN) 500 MG tablet Take 1 tablet (500 mg total) by mouth 2 (two) times daily. 05/21/16   Dione Boozeavid Glick, MD  traMADol (ULTRAM) 50 MG tablet Take 1 tablet (50 mg total) by mouth every 6 (six) hours as needed. 05/21/16   Dione Boozeavid Glick, MD   BP 109/73 mmHg  Pulse 72  Temp(Src) 98.9 F (37.2 C) (Oral)  Resp 18  Ht 4\' 11"  (1.499 m)  Wt 108 lb (48.988 kg)  BMI 21.80 kg/m2  SpO2 100%  LMP 04/10/2016 Physical Exam  Constitutional: She appears well-developed.  HENT:  Head: Atraumatic.  Neck: Neck supple.  Cardiovascular: Normal rate.   Pulmonary/Chest: Breath sounds normal.  Abdominal: There is tenderness.  Left lower quadrant tenderness without rebound or guarding.  Musculoskeletal: Normal range of motion.  Neurological: She is alert.  Skin: Skin is warm.  Pelvic exam showed some left adnexal tenderness and a closed os.  ED Course  Procedures (including critical care time) Labs Review  Labs Reviewed  URINALYSIS, ROUTINE W REFLEX MICROSCOPIC (NOT AT [facility]) - Abnormal; Notable for the following:    Hgb urine dipstick TRACE (*)    Protein, ur TRACE (*)    All other components within normal limits  PREGNANCY, URINE - Abnormal; Notable for the following:    Preg Test, Ur POSITIVE (*)    All other components within normal limits  URINE MICROSCOPIC-ADD ON - Abnormal; Notable for the following:    Squamous Epithelial / LPF 0-5 (*)    Bacteria, UA FEW (*)    All other components within normal limits  CBC WITH DIFFERENTIAL/PLATELET  HCG, QUANTITATIVE, PREGNANCY   BASIC METABOLIC PANEL    Imaging Review [place] Ob Comp Less 14 Wks  05/30/2016  CLINICAL DATA:  Positive home pregnancy test, pain/cramping x2 weeks EXAM: OBSTETRIC <14 WK US AND TRANSVAGINAL OB US TECHNIQUE: Both transabdominal and transvaginal ultrasound examinations were performed for complete evaluation of the gestation as well as the maternal uterus, adnexal regions, and pelvic cul-de-sac. Transvaginal technique was performed to assess early pregnancy. COMPARISON:  None. FINDINGS: Intrauterine gestational sac: None Subchorionic hemorrhage:  None visualized. Maternal uterus/adnexae: Bilateral ovaries are within normal limits. No free fluid. IMPRESSION: No IUP is visualized. By definition, in the setting of a positive pregnancy test, this reflects a pregnancy of unknown location. Differential considerations include early normal IUP, abnormal IUP/missed abortion, or nonvisualized ectopic pregnancy. Serial beta HCG is suggested. Consider repeat pelvic ultrasound in 14 days, as clinically warranted. Electronically Signed   By: Charline BillsSriyesh  Krishnan M.D.   On: 05/30/2016 17:15   [place] Ob Transvaginal  05/30/2016  CLINICAL DATA:  Positive home pregnancy test, pain/cramping x2 weeks EXAM: OBSTETRIC <14 WK US AND TRANSVAGINAL OB US TECHNIQUE: Both transabdominal and transvaginal ultrasound examinations were performed for complete evaluation of the gestation as well as the maternal uterus, adnexal regions, and pelvic cul-de-sac. Transvaginal technique was performed to assess early pregnancy. COMPARISON:  None. FINDINGS: Intrauterine gestational sac: None Subchorionic hemorrhage:  None visualized. Maternal uterus/adnexae: Bilateral ovaries are within normal limits. No free fluid. IMPRESSION: No IUP is visualized. By definition, in the setting of a positive pregnancy test, this reflects a pregnancy of unknown location. Differential considerations include early normal IUP, abnormal IUP/missed abortion, or nonvisualized ectopic  pregnancy. Serial beta HCG is suggested. Consider repeat pelvic ultrasound in 14 days, as clinically warranted. Electronically Signed   By: Charline BillsSriyesh  Krishnan M.D.   On: 05/30/2016 17:15   I have personally reviewed and evaluated these images and lab results as part of my medical decision-making.   EKG Interpretation None      MDM   Final diagnoses:  Pregnancy  Preglaucoma, unspecified laterality    Patient with positive pregnancy test and a beta below probable ultrasound detection. Does have left adnexal tenderness on pelvic exam with a closed os. Patient was informed that ectopic pregnancy is not ruled out. She does have follow-up with Dr. Emelda FearFerguson and will return sooner as needed.    Benjiman CoreNathan Zabella Wease, MD 05/30/16 [phone]

## 2016-05-31 ENCOUNTER — Telehealth: Payer: Self-pay | Admitting: Adult Health

## 2016-05-31 DIAGNOSIS — O200 Threatened abortion: Secondary | ICD-10-CM

## 2016-05-31 NOTE — Telephone Encounter (Signed)
Spoke with pt letting [facility] know [facility]. Pt voiced understanding. Order placed in system for STAT. JSY

## 2016-05-31 NOTE — Telephone Encounter (Signed)
[facility]

## 2016-05-31 NOTE — Addendum Note (Signed)
Addended by: Colen DarlingYOUNG, Sharian Delia S on: 05/31/2016 02:17 PM   Modules accepted: Orders

## 2016-06-01 ENCOUNTER — Telehealth: Payer: Self-pay | Admitting: *Deleted

## 2016-06-01 ENCOUNTER — Telehealth: Payer: Self-pay | Admitting: Adult Health

## 2016-06-01 LAB — BETA HCG QUANT (REF LAB): HCG QUANT: 711 m[IU]/mL

## 2016-06-01 NOTE — Telephone Encounter (Signed)
Call will not go through.  

## 2016-06-01 NOTE — Telephone Encounter (Signed)
Pt aware that number is doubling its 711, keep appt for Monday [facility] and schedule [place]

## 2016-06-01 NOTE — Telephone Encounter (Signed)
Will not go through

## 2016-06-04 ENCOUNTER — Encounter: Payer: Self-pay | Admitting: Adult Health

## 2016-06-04 ENCOUNTER — Ambulatory Visit (INDEPENDENT_AMBULATORY_CARE_PROVIDER_SITE_OTHER): Payer: Medicaid Other | Admitting: Adult Health

## 2016-06-04 VITALS — BP 100/60 | HR 72 | Ht 59.0 in | Wt 109.0 lb

## 2016-06-04 DIAGNOSIS — Z3A01 Less than 8 weeks gestation of pregnancy: Secondary | ICD-10-CM | POA: Diagnosis not present

## 2016-06-04 DIAGNOSIS — Z3490 Encounter for supervision of normal pregnancy, unspecified, unspecified trimester: Secondary | ICD-10-CM

## 2016-06-04 DIAGNOSIS — O3680X0 Pregnancy with inconclusive fetal viability, not applicable or unspecified: Secondary | ICD-10-CM

## 2016-06-04 NOTE — Progress Notes (Signed)
Subjective:     Patient ID: Sylvia Day, female   DOB: [birth year], 30 y.o.   MRN: [id number]  HPI Sylvia Day is a [age] year old Hispanic female [facility] and schedule [facility].She was seen 05/30/16 in ER and had QHCG of 351 and US, which showed no IUP.[facility] and it was [facility], she had not had any bleeding,she has some cramping and headache but zyrtec helped the headache.  Review of Systems Patient denies any daily headaches, hearing loss, fatigue, blurred vision, shortness of breath, chest pain,  problems with bowel movements, urination, or intercourse. No joint pain or mood swings.+cramps, no bleeding    Objective:   Physical Exam BP 100/60 mmHg  Pulse 72  Ht 4\' 11"  (1.499 m)  Wt 109 lb (49.442 kg)  BMI 22.00 kg/m2  LMP 05/16/2017By LMP about 7+6 weeks with ED 01/15/17 but not that far [facility], [facility] and schedule dating US for 3 days.Medicaid form given.    Assessment:     Pregnant Pregnancy with inconclusive viability      Plan:     QHCG today Return in 3 days for dating US Review handout on first ttimester

## 2016-06-04 NOTE — Patient Instructions (Signed)
First Trimester of Pregnancy The first trimester of pregnancy is from week 1 until the end of week 12 (months 1 through 3). A week after a sperm fertilizes an egg, the egg will implant on the wall of the uterus. This embryo will begin to develop into a baby. Genes from you and your partner are forming the baby. The female genes determine whether the baby is a boy or a girl. At 6-8 weeks, the eyes and face are formed, and the heartbeat can be seen on ultrasound. At the end of 12 weeks, all the baby's organs are formed.  Now that you are pregnant, you will want to do everything you can to have a healthy baby. Two of the most important things are to get good prenatal care and to follow your health care provider's instructions. Prenatal care is all the medical care you receive before the baby's birth. This care will help prevent, find, and treat any problems during the pregnancy and childbirth. BODY CHANGES Your body goes through many changes during pregnancy. The changes vary from woman to woman.   You may gain or lose a couple of pounds at first.  You may feel sick to your stomach (nauseous) and throw up (vomit). If the vomiting is uncontrollable, call your health care provider.  You may tire easily.  You may develop headaches that can be relieved by medicines approved by your health care provider.  You may urinate more often. Painful urination may mean you have a bladder infection.  You may develop heartburn as a result of your pregnancy.  You may develop constipation because certain hormones are causing the muscles that push waste through your intestines to slow down.  You may develop hemorrhoids or swollen, bulging veins (varicose veins).  Your breasts may begin to grow larger and become tender. Your nipples may stick out more, and the tissue that surrounds them (areola) may become darker.  Your gums may bleed and may be sensitive to brushing and flossing.  Dark spots or blotches (chloasma,  mask of pregnancy) may develop on your face. This will likely fade after the baby is born.  Your menstrual periods will stop.  You may have a loss of appetite.  You may develop cravings for certain kinds of food.  You may have changes in your emotions from day to day, such as being excited to be pregnant or being concerned that something may go wrong with the pregnancy and baby.  You may have more vivid and strange dreams.  You may have changes in your hair. These can include thickening of your hair, rapid growth, and changes in texture. Some women also have hair loss during or after pregnancy, or hair that feels dry or thin. Your hair will most likely return to normal after your baby is born. WHAT TO EXPECT AT YOUR PRENATAL VISITS During a routine prenatal visit:  You will be weighed to make sure you and the baby are growing normally.  Your blood pressure will be taken.  Your abdomen will be measured to track your baby's growth.  The fetal heartbeat will be listened to starting around week 10 or 12 of your pregnancy.  Test results from any previous visits will be discussed. Your health care provider may ask you:  How you are feeling.  If you are feeling the baby move.  If you have had any abnormal symptoms, such as leaking fluid, bleeding, severe headaches, or abdominal cramping.  If you are using any tobacco products,   including cigarettes, chewing tobacco, and electronic cigarettes.  If you have any questions. Other tests that may be performed during your first trimester include:  Blood tests to find your blood type and to check for the presence of any previous infections. They will also be used to check for low iron levels (anemia) and Rh antibodies. Later in the pregnancy, blood tests for diabetes will be done along with other tests if problems develop.  Urine tests to check for infections, diabetes, or protein in the urine.  An ultrasound to confirm the proper growth  and development of the baby.  An amniocentesis to check for possible genetic problems.  Fetal screens for spina bifida and Down syndrome.  You may need other tests to make sure you and the baby are doing well.  HIV (human immunodeficiency virus) testing. Routine prenatal testing includes screening for HIV, unless you choose not to have this test. HOME CARE INSTRUCTIONS  Medicines  Follow your health care provider's instructions regarding medicine use. Specific medicines may be either safe or unsafe to take during pregnancy.  Take your prenatal vitamins as directed.  If you develop constipation, try taking a stool softener if your health care provider approves. Diet  Eat regular, well-balanced meals. Choose a variety of foods, such as meat or vegetable-based protein, fish, milk and low-fat dairy products, vegetables, fruits, and whole grain breads and cereals. Your health care provider will help you determine the amount of weight gain that is right for you.  Avoid raw meat and uncooked cheese. These carry germs that can cause birth defects in the baby.  Eating four or five small meals rather than three large meals a day may help relieve nausea and vomiting. If you start to feel nauseous, eating a few soda crackers can be helpful. Drinking liquids between meals instead of during meals also seems to help nausea and vomiting.  If you develop constipation, eat more high-fiber foods, such as fresh vegetables or fruit and whole grains. Drink enough fluids to keep your urine clear or pale yellow. Activity and Exercise  Exercise only as directed by your health care provider. Exercising will help you:  Control your weight.  Stay in shape.  Be prepared for labor and delivery.  Experiencing pain or cramping in the lower abdomen or low back is a good sign that you should stop exercising. Check with your health care provider before continuing normal exercises.  Try to avoid standing for long  periods of time. Move your legs often if you must stand in one place for a long time.  Avoid heavy lifting.  Wear low-heeled shoes, and practice good posture.  You may continue to have sex unless your health care provider directs you otherwise. Relief of Pain or Discomfort  Wear a good support bra for breast tenderness.   Take warm sitz baths to soothe any pain or discomfort caused by hemorrhoids. Use hemorrhoid cream if your health care provider approves.   Rest with your legs elevated if you have leg cramps or low back pain.  If you develop varicose veins in your legs, wear support hose. Elevate your feet for 15 minutes, 3-4 times a day. Limit salt in your diet. Prenatal Care  Schedule your prenatal visits by the twelfth week of pregnancy. They are usually scheduled monthly at first, then more often in the last 2 months before delivery.  Write down your questions. Take them to your prenatal visits.  Keep all your prenatal visits as directed by your   health care provider. Safety  Wear your seat belt at all times when driving.  Make a list of emergency phone numbers, including numbers for family, friends, the hospital, and police and fire departments. General Tips  Ask your health care provider for a referral to a local prenatal education class. Begin classes no later than at the beginning of month 6 of your pregnancy.  Ask for help if you have counseling or nutritional needs during pregnancy. Your health care provider can offer advice or refer you to specialists for help with various needs.  Do not use hot tubs, steam rooms, or saunas.  Do not douche or use tampons or scented sanitary pads.  Do not cross your legs for long periods of time.  Avoid cat litter boxes and soil used by cats. These carry germs that can cause birth defects in the baby and possibly loss of the fetus by miscarriage or stillbirth.  Avoid all smoking, herbs, alcohol, and medicines not prescribed by  your health care provider. Chemicals in these affect the formation and growth of the baby.  Do not use any tobacco products, including cigarettes, chewing tobacco, and electronic cigarettes. If you need help quitting, ask your health care provider. You may receive counseling support and other resources to help you quit.  Schedule a dentist appointment. At home, brush your teeth with a soft toothbrush and be gentle when you floss. SEEK MEDICAL CARE IF:   You have dizziness.  You have mild pelvic cramps, pelvic pressure, or nagging pain in the abdominal area.  You have persistent nausea, vomiting, or diarrhea.  You have a bad smelling vaginal discharge.  You have pain with urination.  You notice increased swelling in your face, hands, legs, or ankles. SEEK IMMEDIATE MEDICAL CARE IF:   You have a fever.  You are leaking fluid from your vagina.  You have spotting or bleeding from your vagina.  You have severe abdominal cramping or pain.  You have rapid weight gain or loss.  You vomit blood or material that looks like coffee grounds.  You are exposed to [place] and have never had them.  You are exposed to fifth disease or chickenpox.  You develop a severe headache.  You have shortness of breath.  You have any kind of trauma, such as from a fall or a car accident.   This information is not intended to replace advice given to you by your health care provider. Make sure you discuss any questions you have with your health care provider.   Document Released: 11/06/2001 Document Revised: 12/03/2014 Document Reviewed: 09/22/2013 Elsevier Interactive Patient Education [employer]2016 [employer] Inc[employer] Return Thursday for [place]

## 2016-06-05 ENCOUNTER — Telehealth: Payer: Self-pay | Admitting: Adult Health

## 2016-06-05 LAB — BETA HCG QUANT (REF LAB): hCG Quant: 2514 m[IU]/mL

## 2016-06-05 NOTE — Telephone Encounter (Signed)
Pt aware of QHCG doubling, keep appt 7/13 for [place]

## 2016-06-07 ENCOUNTER — Ambulatory Visit (INDEPENDENT_AMBULATORY_CARE_PROVIDER_SITE_OTHER): Payer: Medicaid Other

## 2016-06-07 ENCOUNTER — Other Ambulatory Visit: Payer: Self-pay | Admitting: Adult Health

## 2016-06-07 DIAGNOSIS — O3680X0 Pregnancy with inconclusive fetal viability, not applicable or unspecified: Secondary | ICD-10-CM

## 2016-06-07 DIAGNOSIS — Z3A01 Less than 8 weeks gestation of pregnancy: Secondary | ICD-10-CM

## 2016-06-07 NOTE — Progress Notes (Signed)
US 5+5 wks GS w/YS, no fetal pole seen,normal ov's bilat,no free fluid,pt will have a f/u ultrasound in 10 days.

## 2016-06-12 ENCOUNTER — Other Ambulatory Visit: Payer: Self-pay | Admitting: Adult Health

## 2016-06-12 DIAGNOSIS — O3680X0 Pregnancy with inconclusive fetal viability, not applicable or unspecified: Secondary | ICD-10-CM

## 2016-06-13 ENCOUNTER — Telehealth: Payer: Self-pay | Admitting: Women's Health

## 2016-06-13 NOTE — Telephone Encounter (Signed)
Pt states is there anything she can take OTC for yeast infection. Pt advised she can take OTC Monistat for yeast infection, if no improvement call our office back. Pt verbalized understanding.

## 2016-06-15 ENCOUNTER — Telehealth: Payer: Self-pay | Admitting: Adult Health

## 2016-06-15 NOTE — Telephone Encounter (Signed)
Pt called stating that she needs a proof of preg. Sent to her case worker. Pt states that her caseworker has lost the paperwork. The fax number is [phone] and it needs to say Attn:Kathy [facility]

## 2016-06-15 NOTE — Telephone Encounter (Signed)
Spoke with pt. Proof of pregnancy done and faxed. JSY

## 2016-06-18 ENCOUNTER — Telehealth: Payer: Self-pay | Admitting: Women's Health

## 2016-06-18 ENCOUNTER — Ambulatory Visit (INDEPENDENT_AMBULATORY_CARE_PROVIDER_SITE_OTHER): Payer: Medicaid Other

## 2016-06-18 DIAGNOSIS — Z3A01 Less than 8 weeks gestation of pregnancy: Secondary | ICD-10-CM

## 2016-06-18 DIAGNOSIS — O3680X0 Pregnancy with inconclusive fetal viability, not applicable or unspecified: Secondary | ICD-10-CM | POA: Diagnosis not present

## 2016-06-18 NOTE — Telephone Encounter (Signed)
Pt states that she thinks she has a yeast infection and was told to try Monistat 7 OTC but it burns really bad when she uses it.  Pt advised she need OV for evaluation and call transferred to front staff for OV to be scheduled.

## 2016-06-18 NOTE — Progress Notes (Signed)
Follow up viability/dating ultrasound today @ 6+[redacted] weeks GA. Single fetus with FHR 126. CRL today 0.98 cm which is not consistent with LMP dating. EDD 02/05/17 with today's exam. Cervix is closed. Bilateral ovaries appear normal. Fluid is subjectively normal. Yolk sac visualized and appears normal. Small SCH noted.

## 2016-06-21 ENCOUNTER — Encounter: Payer: Self-pay | Admitting: Obstetrics & Gynecology

## 2016-06-21 ENCOUNTER — Ambulatory Visit (INDEPENDENT_AMBULATORY_CARE_PROVIDER_SITE_OTHER): Payer: Medicaid Other | Admitting: Obstetrics & Gynecology

## 2016-06-21 VITALS — BP 108/60 | HR 72 | Ht 59.0 in | Wt 110.4 lb

## 2016-06-21 DIAGNOSIS — Z3A01 Less than 8 weeks gestation of pregnancy: Secondary | ICD-10-CM

## 2016-06-21 DIAGNOSIS — N762 Acute vulvitis: Secondary | ICD-10-CM | POA: Diagnosis not present

## 2016-06-21 DIAGNOSIS — O23591 Infection of other part of genital tract in pregnancy, first trimester: Secondary | ICD-10-CM

## 2016-06-21 MED ORDER — NYSTATIN 100000 UNIT/GM EX CREA: TOPICAL_CREAM | Freq: Two times a day (BID) | CUTANEOUS | Status: DC

## 2016-06-21 MED ORDER — NYSTATIN-TRIAMCINOLONE 100000-0.1 UNIT/GM-% EX OINT: 1.0000 "application " | TOPICAL_OINTMENT | Freq: Two times a day (BID) | CUTANEOUS | 0 refills | Status: DC

## 2016-06-21 NOTE — Progress Notes (Signed)
       Chief Complaint  Patient presents with  . gyn visit    vaginal discharge/ itching    Blood pressure 108/60, pulse 72, height 4\' 11"  (1.499 m), weight 110 lb 6.4 oz (50.1 kg), last menstrual period 04/10/2016.  30 y.o. G2P1001 Patient's last menstrual period was 04/10/2016. The current method of family planning is pt is pregnant.  Subjective Vulvar itching for 2weeks Itching yes Irritation no Odor no Similar to previous no  Previous treatment had trich about 3 months ago and was treated as was her partner  Objective Vulva:  normal appearing vulva with no masses, tenderness or lesions Vagina:  normal mucosa, no discharge Cervix:  no cervical motion tenderness, no lesions and nulliparous appearance Uterus:   Adnexa: ovaries:,       Pertinent ROS   Labs or studies Wet Prep:   A sample of vaginal discharge was obtained from the posterior fornix using a cotton swab. 2 drops of saline were placed on a slide and the cotton swab was immersed in the saline. Microscopic evaluation was performed and results were as follows:  Negative  for yeast  Negative for clue cells , consistent with Bacterial vaginosis Negative for trichomonas  Normal WBC population   Whiff test: Negative     Impression Diagnoses this Encounter::   ICD-9-CM ICD-10-CM   1. Vulvitis 616.10 N76.2     Established relevant diagnosis(es): Pregnant , early [redacted]w[redacted]d   EDD 02/05/2017  Plan/Recommendations: Meds ordered this encounter  Medications  . nystatin cream (MYCOSTATIN)    Labs or Scans Ordered: No orders of the defined types were placed in this encounter.   Management:: No evidence or persistent or recurrent trich, no yeast seen either, probable external vulvitis, will treat with externally with nystatin   Follow up Return for keep scheduled appt.     All questions were answered.

## 2016-06-21 NOTE — Addendum Note (Signed)
Addended by: Federico Flake A on: 06/21/2016 03:58 PM   Modules accepted: Orders

## 2016-06-25 ENCOUNTER — Ambulatory Visit (INDEPENDENT_AMBULATORY_CARE_PROVIDER_SITE_OTHER): Payer: Medicaid Other | Admitting: Women's Health

## 2016-06-25 ENCOUNTER — Encounter: Payer: Self-pay | Admitting: Women's Health

## 2016-06-25 ENCOUNTER — Other Ambulatory Visit (HOSPITAL_COMMUNITY)
Admission: RE | Admit: 2016-06-25 | Discharge: 2016-06-25 | Disposition: A | Discharge status: Home / Self Care | Payer: Medicaid Other | Source: Ambulatory Visit | Attending: Obstetrics & Gynecology | Admitting: Obstetrics & Gynecology

## 2016-06-25 VITALS — BP 100/68 | HR 80 | Wt 111.0 lb

## 2016-06-25 DIAGNOSIS — Z01419 Encounter for gynecological examination (general) (routine) without abnormal findings: Secondary | ICD-10-CM | POA: Diagnosis present

## 2016-06-25 DIAGNOSIS — Z3A08 8 weeks gestation of pregnancy: Secondary | ICD-10-CM

## 2016-06-25 DIAGNOSIS — Z113 Encounter for screening for infections with a predominantly sexual mode of transmission: Secondary | ICD-10-CM | POA: Diagnosis present

## 2016-06-25 DIAGNOSIS — Z3490 Encounter for supervision of normal pregnancy, unspecified, unspecified trimester: Secondary | ICD-10-CM | POA: Insufficient documentation

## 2016-06-25 DIAGNOSIS — Z124 Encounter for screening for malignant neoplasm of cervix: Secondary | ICD-10-CM

## 2016-06-25 DIAGNOSIS — Z1389 Encounter for screening for other disorder: Secondary | ICD-10-CM

## 2016-06-25 DIAGNOSIS — Z1151 Encounter for screening for human papillomavirus (HPV): Secondary | ICD-10-CM | POA: Insufficient documentation

## 2016-06-25 DIAGNOSIS — Z3491 Encounter for supervision of normal pregnancy, unspecified, first trimester: Secondary | ICD-10-CM

## 2016-06-25 DIAGNOSIS — Z369 Encounter for antenatal screening, unspecified: Secondary | ICD-10-CM

## 2016-06-25 DIAGNOSIS — O09299 Supervision of pregnancy with other poor reproductive or obstetric history, unspecified trimester: Secondary | ICD-10-CM | POA: Insufficient documentation

## 2016-06-25 DIAGNOSIS — Z0283 Encounter for blood-alcohol and blood-drug test: Secondary | ICD-10-CM

## 2016-06-25 DIAGNOSIS — Z3481 Encounter for supervision of other normal pregnancy, first trimester: Secondary | ICD-10-CM | POA: Diagnosis not present

## 2016-06-25 DIAGNOSIS — Z331 Pregnant state, incidental: Secondary | ICD-10-CM

## 2016-06-25 DIAGNOSIS — O09291 Supervision of pregnancy with other poor reproductive or obstetric history, first trimester: Secondary | ICD-10-CM

## 2016-06-25 LAB — POCT URINALYSIS DIPSTICK
Blood, UA: NEGATIVE
Glucose, UA: NEGATIVE
KETONES UA: NEGATIVE
Nitrite, UA: NEGATIVE

## 2016-06-25 NOTE — Patient Instructions (Addendum)
Begin taking a 81mg  baby aspirin daily at 12 weeks of pregnancy (8/29) to decrease risk of preeclampsia during pregnancy   Nausea & Vomiting  Have saltine crackers or pretzels by your bed and eat a few bites before you raise your head out of bed in the morning  Eat small frequent meals throughout the day instead of large meals  Drink plenty of fluids throughout the day to stay hydrated, just don't drink a lot of fluids with your meals.  This can make your stomach fill up faster making you feel sick  Do not brush your teeth right after you eat  Products with real ginger are good for nausea, like ginger ale and ginger hard candy Make sure it says made with real ginger!  Sucking on sour candy like lemon heads is also good for nausea  If your prenatal vitamins make you nauseated, take them at night so you will sleep through the nausea  Sea Bands  If you feel like you need medicine for the nausea & vomiting please let us know  If you are unable to keep any fluids or food down please let us know   Constipation  Drink plenty of fluid, preferably water, throughout the day  Eat foods high in fiber such as fruits, vegetables, and grains  Exercise, such as walking, is a good way to keep your bowels regular  Drink warm fluids, especially warm prune juice, or decaf coffee  Eat a 1/2 cup of real oatmeal (not instant), 1/2 cup applesauce, and 1/2-1 cup warm prune juice every day  If needed, you may take Colace (docusate sodium) stool softener once or twice a day to help keep the stool soft. If you are pregnant, wait until you are out of your first trimester (12-14 weeks of pregnancy)  If you still are having problems with constipation, you may take Miralax once daily as needed to help keep your bowels regular.  If you are pregnant, wait until you are out of your first trimester (12-14 weeks of pregnancy)   First Trimester of Pregnancy The first trimester of pregnancy is from week 1 until  the end of week 12 (months 1 through 3). A week after a sperm fertilizes an egg, the egg will implant on the wall of the uterus. This embryo will begin to develop into a baby. Genes from you and your partner are forming the baby. The female genes determine whether the baby is a boy or a girl. At 6-8 weeks, the eyes and face are formed, and the heartbeat can be seen on ultrasound. At the end of 12 weeks, all the baby's organs are formed.  Now that you are pregnant, you will want to do everything you can to have a healthy baby. Two of the most important things are to get good prenatal care and to follow your health care provider's instructions. Prenatal care is all the medical care you receive before the baby's birth. This care will help prevent, find, and treat any problems during the pregnancy and childbirth. BODY CHANGES Your body goes through many changes during pregnancy. The changes vary from woman to woman.   You may gain or lose a couple of pounds at first.  You may feel sick to your stomach (nauseous) and throw up (vomit). If the vomiting is uncontrollable, call your health care provider.  You may tire easily.  You may develop headaches that can be relieved by medicines approved by your health care provider.  You may urinate  more often. Painful urination may mean you have a bladder infection.  You may develop heartburn as a result of your pregnancy.  You may develop constipation because certain hormones are causing the muscles that push waste through your intestines to slow down.  You may develop hemorrhoids or swollen, bulging veins (varicose veins).  Your breasts may begin to grow larger and become tender. Your nipples may stick out more, and the tissue that surrounds them (areola) may become darker.  Your gums may bleed and may be sensitive to brushing and flossing.  Dark spots or blotches (chloasma, mask of pregnancy) may develop on your face. This will likely fade after the baby is  born.  Your menstrual periods will stop.  You may have a loss of appetite.  You may develop cravings for certain kinds of food.  You may have changes in your emotions from day to day, such as being excited to be pregnant or being concerned that something may go wrong with the pregnancy and baby.  You may have more vivid and strange dreams.  You may have changes in your hair. These can include thickening of your hair, rapid growth, and changes in texture. Some women also have hair loss during or after pregnancy, or hair that feels dry or thin. Your hair will most likely return to normal after your baby is born. WHAT TO EXPECT AT YOUR PRENATAL VISITS During a routine prenatal visit:  You will be weighed to make sure you and the baby are growing normally.  Your blood pressure will be taken.  Your abdomen will be measured to track your baby's growth.  The fetal heartbeat will be listened to starting around week 10 or 12 of your pregnancy.  Test results from any previous visits will be discussed. Your health care provider may ask you:  How you are feeling.  If you are feeling the baby move.  If you have had any abnormal symptoms, such as leaking fluid, bleeding, severe headaches, or abdominal cramping.  If you have any questions. Other tests that may be performed during your first trimester include:  Blood tests to find your blood type and to check for the presence of any previous infections. They will also be used to check for low iron levels (anemia) and Rh antibodies. Later in the pregnancy, blood tests for diabetes will be done along with other tests if problems develop.  Urine tests to check for infections, diabetes, or protein in the urine.  An ultrasound to confirm the proper growth and development of the baby.  An amniocentesis to check for possible genetic problems.  Fetal screens for spina bifida and Down syndrome.  You may need other tests to make sure you and the  baby are doing well. HOME CARE INSTRUCTIONS  Medicines  Follow your health care provider's instructions regarding medicine use. Specific medicines may be either safe or unsafe to take during pregnancy.  Take your prenatal vitamins as directed.  If you develop constipation, try taking a stool softener if your health care provider approves. Diet  Eat regular, well-balanced meals. Choose a variety of foods, such as meat or vegetable-based protein, fish, milk and low-fat dairy products, vegetables, fruits, and whole grain breads and cereals. Your health care provider will help you determine the amount of weight gain that is right for you.  Avoid raw meat and uncooked cheese. These carry germs that can cause birth defects in the baby.  Eating four or five small meals rather than three   large meals a day may help relieve nausea and vomiting. If you start to feel nauseous, eating a few soda crackers can be helpful. Drinking liquids between meals instead of during meals also seems to help nausea and vomiting.  If you develop constipation, eat more high-fiber foods, such as fresh vegetables or fruit and whole grains. Drink enough fluids to keep your urine clear or pale yellow. Activity and Exercise  Exercise only as directed by your health care provider. Exercising will help you:  Control your weight.  Stay in shape.  Be prepared for labor and delivery.  Experiencing pain or cramping in the lower abdomen or low back is a good sign that you should stop exercising. Check with your health care provider before continuing normal exercises.  Try to avoid standing for long periods of time. Move your legs often if you must stand in one place for a long time.  Avoid heavy lifting.  Wear low-heeled shoes, and practice good posture.  You may continue to have sex unless your health care provider directs you otherwise. Relief of Pain or Discomfort  Wear a good support bra for breast tenderness.    Take warm sitz baths to soothe any pain or discomfort caused by hemorrhoids. Use hemorrhoid cream if your health care provider approves.   Rest with your legs elevated if you have leg cramps or low back pain.  If you develop varicose veins in your legs, wear support hose. Elevate your feet for 15 minutes, 3-4 times a day. Limit salt in your diet. Prenatal Care  Schedule your prenatal visits by the twelfth week of pregnancy. They are usually scheduled monthly at first, then more often in the last 2 months before delivery.  Write down your questions. Take them to your prenatal visits.  Keep all your prenatal visits as directed by your health care provider. Safety  Wear your seat belt at all times when driving.  Make a list of emergency phone numbers, including numbers for family, friends, the hospital, and police and fire departments. General Tips  Ask your health care provider for a referral to a local prenatal education class. Begin classes no later than at the beginning of month 6 of your pregnancy.  Ask for help if you have counseling or nutritional needs during pregnancy. Your health care provider can offer advice or refer you to specialists for help with various needs.  Do not use hot tubs, steam rooms, or saunas.  Do not douche or use tampons or scented sanitary pads.  Do not cross your legs for long periods of time.  Avoid cat litter boxes and soil used by cats. These carry germs that can cause birth defects in the baby and possibly loss of the fetus by miscarriage or stillbirth.  Avoid all smoking, herbs, alcohol, and medicines not prescribed by your health care provider. Chemicals in these affect the formation and growth of the baby.  Schedule a dentist appointment. At home, brush your teeth with a soft toothbrush and be gentle when you floss. SEEK MEDICAL CARE IF:   You have dizziness.  You have mild pelvic cramps, pelvic pressure, or nagging pain in the abdominal  area.  You have persistent nausea, vomiting, or diarrhea.  You have a bad smelling vaginal discharge.  You have pain with urination.  You notice increased swelling in your face, hands, legs, or ankles. SEEK IMMEDIATE MEDICAL CARE IF:   You have a fever.  You are leaking fluid from your vagina.  You   have spotting or bleeding from your vagina.  You have severe abdominal cramping or pain.  You have rapid weight gain or loss.  You vomit blood or material that looks like coffee grounds.  You are exposed to [place] measles and have never had them.  You are exposed to fifth disease or chickenpox.  You develop a severe headache.  You have shortness of breath.  You have any kind of trauma, such as from a fall or a car accident. Document Released: 11/06/2001 Document Revised: 03/29/2014 Document Reviewed: 09/22/2013 [employer] Patient Information 2015 [employer]. This information is not intended to replace advice given to you by your health care provider. Make sure you discuss any questions you have with your health care provider.   

## 2016-06-25 NOTE — Progress Notes (Signed)
  Subjective:  Sylvia Day is a 30 y.o. G25P1001 Hispanic female at [redacted]w[redacted]d by 6wk u/s, being seen today for her first obstetrical visit.  Her obstetrical history is significant for term IOL d/t pre-e was on mag, had 4th degree lac, and PPH dropping Hgb from 9.8 to 4.1- was Jehovah's witness at the time and refused PRBC- no longer Jehovah's witness and would be fine w/ blood products in the future. She was 'traumatized' from last birth, and is apprehensive about this birth. Infant weighed 6lb13oz.  Pregnancy history fully reviewed.  Patient reports some nausea in am's- no vomiting- declines meds at this time. Denies vb, cramping, uti s/s, abnormal/malodorous vag d/c, or vulvovaginal itching/irritation.  BP 100/68   Pulse 80   Wt 111 lb (50.3 kg)   LMP 04/10/2016   BMI 22.42 kg/m   HISTORY: OB History  Gravida Para Term Preterm AB Living  2 1 1     1   SAB TAB Ectopic Multiple Live Births          1    # Outcome Date GA Lbr Len/2nd Weight Sex Delivery Anes PTL Lv  2 Current           1 Term 09/03/08 [redacted]w[redacted]d  6 lb 13 oz (3.09 kg) F Vag-Spont  N LIV     Past Medical History:  Diagnosis Date  . Hives   . Medical history non-contributory   . Pregnant 06/04/2016   Past Surgical History:  Procedure Laterality Date  . NO PAST SURGERIES     Family History  Problem Relation Age of Onset  . Stroke Father   . Stroke Maternal Grandmother   . Cancer Paternal Grandmother   . Other Paternal Grandfather     disabled    Exam   System:     General: Well developed & nourished, no acute distress   Skin: Warm & dry, normal coloration and turgor, no rashes   Neurologic: Alert & oriented, normal mood   Cardiovascular: Regular rate & rhythm   Respiratory: Effort & rate normal, LCTAB, acyanotic   Abdomen: Soft, non tender   Extremities: normal strength, tone   Pelvic Exam:    Perineum: Normal perineum   Vulva: Normal, no lesions   Vagina:  Normal mucosa, normal discharge   Cervix:  Normal, bulbous, appears closed   Uterus: Normal size/shape/contour for GA   Thin prep pap smear obtained today w/ high risk HPV cotesting   Assessment:   Pregnancy: G2P1001 Patient Active Problem List   Diagnosis Date Noted  . Supervision of normal pregnancy 06/25/2016    Priority: High    [redacted]w[redacted]d G2P1001 New OB visit H/O Pre-e H/O PPH H/O 4th degree lac  Plan:  Initial labs drawn including baseline CMP and P:C ratio d/t h/o pre-e Continue prenatal vitamins Problem list reviewed and updated Reviewed n/v relief measures and warning s/s to report Reviewed recommended weight gain based on pre-gravid BMI Encouraged well-balanced diet Genetic Screening discussed Integrated Screen and AFP: declined Cystic fibrosis screening discussed declined Ultrasound discussed; fetal survey: requested Follow up in 4 weeks for visit [facility] completed Begin taking a 81mg  baby aspirin daily at 12 weeks of pregnancy to decrease risk of preeclampsia during pregnancy   Marge Duncans CNM, [facility] 06/25/2016 10:19 AM

## 2016-06-26 LAB — COMPREHENSIVE METABOLIC PANEL
A/G RATIO: 1.4 (ref 1.2–2.2)
ALBUMIN: 4.7 g/dL (ref 3.5–5.5)
ALK PHOS: 66 IU/L (ref 39–117)
ALT: 16 IU/L (ref 0–32)
AST: 20 IU/L (ref 0–40)
BILIRUBIN TOTAL: 0.3 mg/dL (ref 0.0–1.2)
BUN / CREAT RATIO: 20 (ref 9–23)
BUN: 9 mg/dL (ref 6–20)
CHLORIDE: 100 mmol/L (ref 96–106)
CO2: 18 mmol/L (ref 18–29)
Calcium: 9.5 mg/dL (ref 8.7–10.2)
Creatinine, Ser: 0.44 mg/dL — ABNORMAL LOW (ref 0.57–1.00)
GFR calc Af Amer: 156 mL/min/{1.73_m2} (ref >59)
GFR calc non Af Amer: 135 mL/min/{1.73_m2} (ref >59)
GLUCOSE: 79 mg/dL (ref 65–99)
Globulin, Total: 3.3 g/dL (ref 1.5–4.5)
POTASSIUM: 4.8 mmol/L (ref 3.5–5.2)
Sodium: 137 mmol/L (ref 134–144)
Total Protein: 8 g/dL (ref 6.0–8.5)

## 2016-06-26 LAB — URINALYSIS, ROUTINE W REFLEX MICROSCOPIC
Bilirubin, UA: NEGATIVE
GLUCOSE, UA: NEGATIVE
KETONES UA: NEGATIVE
NITRITE UA: NEGATIVE
RBC, UA: NEGATIVE
SPEC GRAV UA: 1.027 (ref 1.005–1.030)
UUROB: 1 mg/dL (ref 0.2–1.0)
pH, UA: 8 — ABNORMAL HIGH (ref 5.0–7.5)

## 2016-06-26 LAB — CBC
Hematocrit: 39.6 % (ref 34.0–46.6)
Hemoglobin: 13.6 g/dL (ref 11.1–15.9)
MCH: 30.4 pg (ref 26.6–33.0)
MCHC: 34.3 g/dL (ref 31.5–35.7)
MCV: 88 fL (ref 79–97)
PLATELETS: 402 10*3/uL — AB (ref 150–379)
RBC: 4.48 x10E6/uL (ref 3.77–5.28)
RDW: 13.6 % (ref 12.3–15.4)
WBC: 8.1 10*3/uL (ref 3.4–10.8)

## 2016-06-26 LAB — PMP SCREEN PROFILE (10S), URINE
Amphetamine Screen, Ur: NEGATIVE ng/mL
BARBITURATE SCRN UR: NEGATIVE ng/mL
Benzodiazepine Screen, Urine: NEGATIVE ng/mL
CREATININE(CRT), U: 177.4 mg/dL (ref 20.0–300.0)
Cannabinoids Ur Ql Scn: NEGATIVE ng/mL
Cocaine(Metab.)Screen, Urine: NEGATIVE ng/mL
METHADONE SCREEN, URINE: NEGATIVE ng/mL
Opiate Scrn, Ur: NEGATIVE ng/mL
Oxycodone+Oxymorphone Ur Ql Scn: NEGATIVE ng/mL
PCP Scrn, Ur: NEGATIVE ng/mL
PROPOXYPHENE SCREEN: NEGATIVE ng/mL
Ph of Urine: 8.7 (ref 4.5–8.9)

## 2016-06-26 LAB — VARICELLA ZOSTER ANTIBODY, IGG: VARICELLA: 2119 {index} (ref >165)

## 2016-06-26 LAB — MICROSCOPIC EXAMINATION: CASTS: NONE SEEN /LPF

## 2016-06-26 LAB — HIV ANTIBODY (ROUTINE TESTING W REFLEX): HIV Screen 4th Generation wRfx: NONREACTIVE

## 2016-06-26 LAB — PROTEIN / CREATININE RATIO, URINE
CREATININE, UR: 140.6 mg/dL
Protein, Ur: 32.2 mg/dL
Protein/Creat Ratio: 229 mg/g creat — ABNORMAL HIGH (ref 0–200)

## 2016-06-26 LAB — ANTIBODY SCREEN: ANTIBODY SCREEN: NEGATIVE

## 2016-06-26 LAB — HEPATITIS B SURFACE ANTIGEN: Hepatitis B Surface Ag: NEGATIVE

## 2016-06-26 LAB — ABO/RH: Rh Factor: POSITIVE

## 2016-06-26 LAB — SICKLE CELL SCREEN: Sickle Cell Screen: NEGATIVE

## 2016-06-26 LAB — RUBELLA SCREEN: Rubella Antibodies, IGG: 3.95 index (ref >0.99)

## 2016-06-27 LAB — URINE CULTURE

## 2016-06-28 LAB — CYTOLOGY - PAP

## 2016-07-04 ENCOUNTER — Telehealth: Payer: Self-pay | Admitting: Adult Health

## 2016-07-04 MED ORDER — DOXYLAMINE-PYRIDOXINE 10-10 MG PO TBEC: DELAYED_RELEASE_TABLET | ORAL | 1 refills | Status: DC

## 2016-07-04 NOTE — Telephone Encounter (Signed)
Spoke with pt letting her know Diclegis was sent to pharmacy. Pt was advised to take Diclegis, eat often, and take ES Tylenol for headache. If headaches are still present after this, she will need to be seen. Pt voiced understanding. JSY

## 2016-07-04 NOTE — Telephone Encounter (Signed)
Spoke with pt. Pt started having nausea and headache Saturday. No vomiting. Has tried Tylenol with no help. + stomach upset. No diarrhea. Please advise. Can you order something for nausea and headache? Thanks!! JSY

## 2016-07-04 NOTE — Telephone Encounter (Signed)
Will rx diclegis  

## 2016-07-18 ENCOUNTER — Telehealth: Payer: Self-pay | Admitting: Women's Health

## 2016-07-18 NOTE — Telephone Encounter (Signed)
Pt informed per Cyril MourningJennifer Griffin, NP can not high light hair at 11 weeks of pregnancy. Pt verbalized understanding.

## 2016-07-23 ENCOUNTER — Encounter: Payer: Self-pay | Admitting: Women's Health

## 2016-07-23 ENCOUNTER — Ambulatory Visit (INDEPENDENT_AMBULATORY_CARE_PROVIDER_SITE_OTHER): Payer: Medicaid Other | Admitting: Women's Health

## 2016-07-23 VITALS — BP 118/74 | HR 76 | Wt 111.0 lb

## 2016-07-23 DIAGNOSIS — Z331 Pregnant state, incidental: Secondary | ICD-10-CM

## 2016-07-23 DIAGNOSIS — Z1389 Encounter for screening for other disorder: Secondary | ICD-10-CM

## 2016-07-23 DIAGNOSIS — Z3491 Encounter for supervision of normal pregnancy, unspecified, first trimester: Secondary | ICD-10-CM

## 2016-07-23 LAB — POCT URINALYSIS DIPSTICK
Glucose, UA: NEGATIVE
KETONES UA: NEGATIVE
Nitrite, UA: NEGATIVE
PROTEIN UA: NEGATIVE
RBC UA: NEGATIVE

## 2016-07-23 NOTE — Progress Notes (Signed)
Low-risk OB appointment G2P1001 [id number] Estimated Date of Delivery: 02/05/17 LMP 04/10/2016   BP, weight, and urine reviewed.  Refer to obstetrical flow sheet for FH & FHR.  No fm yet. Denies cramping, lof, vb, or uti s/s. Nausea- taking 3 diclegis- can add 4th and let us know if still not better. Headaches, dizziness- gave printed prevention/relief measures.  Unable to hear fht w/ doppler, informal transabdominal u/s, fetus very active, fhr 160s Reviewed warning s/s to report. Begin baby asa daily tomorrow for h/o pre-e Plan:  Continue routine obstetrical care  F/U in 4wks for OB appointment  Declines genetic screening

## 2016-07-23 NOTE — Patient Instructions (Addendum)
Begin taking a 81mg  baby aspirin daily at 12 weeks of pregnancy (tomorrow) to decrease risk of preeclampsia during pregnancy   Nausea & Vomiting  Have saltine crackers or pretzels by your bed and eat a few bites before you raise your head out of bed in the morning  Eat small frequent meals throughout the day instead of large meals  Drink plenty of fluids throughout the day to stay hydrated, just don't drink a lot of fluids with your meals.  This can make your stomach fill up faster making you feel sick  Do not brush your teeth right after you eat  Products with real ginger are good for nausea, like ginger ale and ginger hard candy Make sure it says made with real ginger!  Sucking on sour candy like lemon heads is also good for nausea  If your prenatal vitamins make you nauseated, take them at night so you will sleep through the nausea  Sea Bands  If you feel like you need medicine for the nausea & vomiting please let us know  If you are unable to keep any fluids or food down please let us know   For Headaches:   Stay well hydrated, drink enough water so that your urine is clear, sometimes if you are dehydrated you can get headaches  Eat small frequent meals and snacks, sometimes if you are hungry you can get headaches  Sometimes you get headaches during pregnancy from the pregnancy hormones  You can try tylenol (1-2 regular strength 325mg  or 1-2 extra strength 500mg ) as directed on the box. The least amount of medication that works is best.   Cool compresses (cool wet washcloth or ice pack) to area of head that is hurting  You can also try drinking a caffeinated drink to see if this will help  If not helping, try below:  For Prevention of Headaches/Migraines:  CoQ10 100mg  three times daily  Vitamin B2 400mg  daily  Magnesium Oxide 400-600mg  daily  If You Get a Bad Headache/Migraine:  Benadryl 25mg    Magnesium Oxide  1 large Gatorade  2 extra strength Tylenol  (1,000mg  total)  1 cup coffee or Coke  If this doesn't help please call us @ [phone]   For Dizzy Spells:   This is usually related to either your blood sugar or your blood pressure dropping  Make sure you are staying well hydrated and drinking enough water so that your urine is clear  Eat small frequent meals and snacks containing protein (meat, eggs, nuts, cheese) so that your blood sugar doesn't drop  If you do get dizzy, sit/lay down and get you something to drink and a snack containing protein- you will usually start feeling better in 10-20 minutes   Second Trimester of Pregnancy The second trimester is from week 13 through week 28, months 4 through 6. The second trimester is often a time when you feel your best. Your body has also adjusted to being pregnant, and you begin to feel better physically. Usually, morning sickness has lessened or quit completely, you may have more energy, and you may have an increase in appetite. The second trimester is also a time when the fetus is growing rapidly. At the end of the sixth month, the fetus is about 9 inches long and weighs about 1 pounds. You will likely begin to feel the baby move (quickening) between 18 and 20 weeks of the pregnancy. BODY CHANGES Your body goes through many changes during pregnancy. The changes vary from  woman to woman.   Your weight will continue to increase. You will notice your lower abdomen bulging out.  You may begin to get stretch marks on your hips, abdomen, and breasts.  You may develop headaches that can be relieved by medicines approved by your health care provider.  You may urinate more often because the fetus is pressing on your bladder.  You may develop or continue to have heartburn as a result of your pregnancy.  You may develop constipation because certain hormones are causing the muscles that push waste through your intestines to slow down.  You may develop hemorrhoids or swollen, bulging veins  (varicose veins).  You may have back pain because of the weight gain and pregnancy hormones relaxing your joints between the bones in your pelvis and as a result of a shift in weight and the muscles that support your balance.  Your breasts will continue to grow and be tender.  Your gums may bleed and may be sensitive to brushing and flossing.  Dark spots or blotches (chloasma, mask of pregnancy) may develop on your face. This will likely fade after the baby is born.  A dark line from your belly button to the pubic area (linea nigra) may appear. This will likely fade after the baby is born.  You may have changes in your hair. These can include thickening of your hair, rapid growth, and changes in texture. Some women also have hair loss during or after pregnancy, or hair that feels dry or thin. Your hair will most likely return to normal after your baby is born. WHAT TO EXPECT AT YOUR PRENATAL VISITS During a routine prenatal visit:  You will be weighed to make sure you and the fetus are growing normally.  Your blood pressure will be taken.  Your abdomen will be measured to track your baby's growth.  The fetal heartbeat will be listened to.  Any test results from the previous visit will be discussed. Your health care provider may ask you:  How you are feeling.  If you are feeling the baby move.  If you have had any abnormal symptoms, such as leaking fluid, bleeding, severe headaches, or abdominal cramping.  If you are using any tobacco products, including cigarettes, chewing tobacco, and electronic cigarettes.  If you have any questions. Other tests that may be performed during your second trimester include:  Blood tests that check for:  Low iron levels (anemia).  Gestational diabetes (between 24 and 28 weeks).  Rh antibodies.  Urine tests to check for infections, diabetes, or protein in the urine.  An ultrasound to confirm the proper growth and development of the  baby.  An amniocentesis to check for possible genetic problems.  Fetal screens for spina bifida and Down syndrome.  HIV (human immunodeficiency virus) testing. Routine prenatal testing includes screening for HIV, unless you choose not to have this test. HOME CARE INSTRUCTIONS   Avoid all smoking, herbs, alcohol, and unprescribed drugs. These chemicals affect the formation and growth of the baby.  Do not use any tobacco products, including cigarettes, chewing tobacco, and electronic cigarettes. If you need help quitting, ask your health care provider. You may receive counseling support and other resources to help you quit.  Follow your health care provider's instructions regarding medicine use. There are medicines that are either safe or unsafe to take during pregnancy.  Exercise only as directed by your health care provider. Experiencing uterine cramps is a good sign to stop exercising.  Continue to  eat regular, healthy meals.  Wear a good support bra for breast tenderness.  Do not use hot tubs, steam rooms, or saunas.  Wear your seat belt at all times when driving.  Avoid raw meat, uncooked cheese, cat litter boxes, and soil used by cats. These carry germs that can cause birth defects in the baby.  Take your prenatal vitamins.  Take 1500-2000 mg of calcium daily starting at the 20th week of pregnancy until you deliver your baby.  Try taking a stool softener (if your health care provider approves) if you develop constipation. Eat more high-fiber foods, such as fresh vegetables or fruit and whole grains. Drink plenty of fluids to keep your urine clear or pale yellow.  Take warm sitz baths to soothe any pain or discomfort caused by hemorrhoids. Use hemorrhoid cream if your health care provider approves.  If you develop varicose veins, wear support hose. Elevate your feet for 15 minutes, 3-4 times a day. Limit salt in your diet.  Avoid heavy lifting, wear low heel shoes, and  practice good posture.  Rest with your legs elevated if you have leg cramps or low back pain.  Visit your dentist if you have not gone yet during your pregnancy. Use a soft toothbrush to brush your teeth and be gentle when you floss.  A sexual relationship may be continued unless your health care provider directs you otherwise.  Continue to go to all your prenatal visits as directed by your health care provider. SEEK MEDICAL CARE IF:   You have dizziness.  You have mild pelvic cramps, pelvic pressure, or nagging pain in the abdominal area.  You have persistent nausea, vomiting, or diarrhea.  You have a bad smelling vaginal discharge.  You have pain with urination. SEEK IMMEDIATE MEDICAL CARE IF:   You have a fever.  You are leaking fluid from your vagina.  You have spotting or bleeding from your vagina.  You have severe abdominal cramping or pain.  You have rapid weight gain or loss.  You have shortness of breath with chest pain.  You notice sudden or extreme swelling of your face, hands, ankles, feet, or legs.  You have not felt your baby move in over an hour.  You have severe headaches that do not go away with medicine.  You have vision changes.   This information is not intended to replace advice given to you by your health care provider. Make sure you discuss any questions you have with your health care provider.   Document Released: 11/06/2001 Document Revised: 12/03/2014 Document Reviewed: 01/13/2013 Elsevier Interactive Patient Education [employer]

## 2016-08-02 ENCOUNTER — Telehealth: Payer: Self-pay | Admitting: Advanced Practice Midwife

## 2016-08-02 NOTE — Telephone Encounter (Signed)
Pt informed HGB 13.6, continue the PNV she has, take Diclegis for nausea, push fluids, eat frequent meals/snacks.  Pt also c/o headaches, pt advised can take Tylenol if no improvement to call our office back. Pt verbalized understanding.

## 2016-08-21 ENCOUNTER — Ambulatory Visit (INDEPENDENT_AMBULATORY_CARE_PROVIDER_SITE_OTHER): Payer: Medicaid Other | Admitting: Women's Health

## 2016-08-21 VITALS — BP 94/62 | HR 78 | Wt 110.6 lb

## 2016-08-21 DIAGNOSIS — Z331 Pregnant state, incidental: Secondary | ICD-10-CM

## 2016-08-21 DIAGNOSIS — Z23 Encounter for immunization: Secondary | ICD-10-CM | POA: Diagnosis not present

## 2016-08-21 DIAGNOSIS — Z3492 Encounter for supervision of normal pregnancy, unspecified, second trimester: Secondary | ICD-10-CM

## 2016-08-21 DIAGNOSIS — Z1389 Encounter for screening for other disorder: Secondary | ICD-10-CM

## 2016-08-21 DIAGNOSIS — Z363 Encounter for antenatal screening for malformations: Secondary | ICD-10-CM

## 2016-08-21 LAB — POCT URINALYSIS DIPSTICK
Glucose, UA: NEGATIVE
Ketones, UA: NEGATIVE
NITRITE UA: NEGATIVE
RBC UA: NEGATIVE

## 2016-08-21 NOTE — Progress Notes (Signed)
Low-risk OB appointment G2P1001 [id number] Estimated Date of Delivery: 02/05/17 BP 94/62   Pulse 78   Wt 110 lb 9.6 oz (50.2 kg)   LMP 04/10/2016   BMI 22.34 kg/m   BP, weight, and urine reviewed.  Refer to obstetrical flow sheet for FH & FHR.  No fm yet. Denies cramping, lof, vb, or uti s/s. No complaints. Exposed to canine parvo, per CDC can not be transmitted to humans.  Reviewed warning s/s to report. Plan:  Continue routine obstetrical care  F/U in 3wks for OB appointment and anatomy u/s Declines genetic screening, flu shot given today

## 2016-08-21 NOTE — Patient Instructions (Signed)

## 2016-08-31 ENCOUNTER — Telehealth: Payer: Self-pay | Admitting: Women's Health

## 2016-08-31 NOTE — Telephone Encounter (Signed)
Spoke with pt. Pt has had some cramping. No spotting or bleeding. Tylenol not helping. I advised with no bleeding, that's a good sign. Advised to take it easy when she notices the cramping. Drink plenty of fluids and continue Tylenol as needed. If she starts spotting or bleeding, let us know. Pt voiced understanding. JSY

## 2016-09-11 ENCOUNTER — Ambulatory Visit (INDEPENDENT_AMBULATORY_CARE_PROVIDER_SITE_OTHER): Payer: Medicaid Other | Admitting: Women's Health

## 2016-09-11 ENCOUNTER — Encounter: Payer: Self-pay | Admitting: Women's Health

## 2016-09-11 ENCOUNTER — Ambulatory Visit (INDEPENDENT_AMBULATORY_CARE_PROVIDER_SITE_OTHER): Payer: Medicaid Other

## 2016-09-11 VITALS — BP 100/70 | HR 72 | Wt 114.0 lb

## 2016-09-11 DIAGNOSIS — Z363 Encounter for antenatal screening for malformations: Secondary | ICD-10-CM | POA: Diagnosis not present

## 2016-09-11 DIAGNOSIS — Z1389 Encounter for screening for other disorder: Secondary | ICD-10-CM

## 2016-09-11 DIAGNOSIS — Z331 Pregnant state, incidental: Secondary | ICD-10-CM

## 2016-09-11 DIAGNOSIS — Z3A19 19 weeks gestation of pregnancy: Secondary | ICD-10-CM

## 2016-09-11 DIAGNOSIS — Z3482 Encounter for supervision of other normal pregnancy, second trimester: Secondary | ICD-10-CM

## 2016-09-11 LAB — POCT URINALYSIS DIPSTICK
Blood, UA: NEGATIVE
Glucose, UA: NEGATIVE
KETONES UA: NEGATIVE
Leukocytes, UA: NEGATIVE
Nitrite, UA: NEGATIVE
PROTEIN UA: NEGATIVE

## 2016-09-11 NOTE — Patient Instructions (Signed)

## 2016-09-11 NOTE — Progress Notes (Signed)
US 19 wks,cephalic,cx 4.1 cm,ant pl gr 0,normal ov's bilat,fhr 154 bpm,svp of fluid 5.6 cm,efw 283 g,anatomy complete,no obvious abnormalities seen

## 2016-09-11 NOTE — Progress Notes (Signed)
Low-risk OB appointment G2P1001 [id number] Estimated Date of Delivery: 02/05/17 BP 100/70   Pulse 72   Wt 114 lb (51.7 kg)   LMP 04/10/2016   BMI 23.03 kg/m   BP, weight, and urine reviewed.  Refer to obstetrical flow sheet for FH & FHR.  Reports good fm.  Denies lof, vb, or uti s/s.  Some cramping.  Reviewed today's normal anatomy u/s- CL 4+cm, warning s/s to report, fm. Increase po fluids.  Plan:  Continue routine obstetrical care  F/U in 4wks for OB appointment

## 2016-09-28 ENCOUNTER — Telehealth: Payer: Self-pay | Admitting: *Deleted

## 2016-09-28 NOTE — Telephone Encounter (Signed)
Pt states she is having contractions off and on throughout the day, worse in the mornings and night time.  Pt reports good FM, no bleeding or loss of fluids.  Advise pt to monitor, push fluids, take Tylenol PRN and rest when can and call if any new or worsening symptoms.  Pt verbalized understanding.

## 2016-10-09 ENCOUNTER — Ambulatory Visit (INDEPENDENT_AMBULATORY_CARE_PROVIDER_SITE_OTHER): Payer: Medicaid Other | Admitting: Advanced Practice Midwife

## 2016-10-09 VITALS — BP 92/62 | HR 74 | Wt 115.8 lb

## 2016-10-09 DIAGNOSIS — Z3482 Encounter for supervision of other normal pregnancy, second trimester: Secondary | ICD-10-CM

## 2016-10-09 DIAGNOSIS — N898 Other specified noninflammatory disorders of vagina: Secondary | ICD-10-CM

## 2016-10-09 DIAGNOSIS — Z331 Pregnant state, incidental: Secondary | ICD-10-CM | POA: Diagnosis not present

## 2016-10-09 DIAGNOSIS — Z3A23 23 weeks gestation of pregnancy: Secondary | ICD-10-CM

## 2016-10-09 DIAGNOSIS — Z1389 Encounter for screening for other disorder: Secondary | ICD-10-CM

## 2016-10-09 LAB — POCT URINALYSIS DIPSTICK
Glucose, UA: NEGATIVE
Ketones, UA: NEGATIVE
NITRITE UA: NEGATIVE
PROTEIN UA: NEGATIVE

## 2016-10-09 MED ORDER — METRONIDAZOLE 0.75 % VA GEL: 1.0000 | Freq: Every day | VAGINAL | 1 refills | Status: DC

## 2016-10-09 NOTE — Patient Instructions (Addendum)
1. Before your test, do not eat or drink anything for 8-10 hours prior to your  appointment (a small amount of water is allowed and you may take any medicines you normally take). Be sure to drink lots of water the day before. 2. When you arrive, your blood will be drawn for a 'fasting' blood sugar level.  Then you will be given a sweetened carbonated beverage to drink. You should  complete drinking this beverage within five minutes. After finishing the  beverage, you will have your blood drawn exactly 1 and 2 hours later. Having  your blood drawn on time is an important part of this test. A total of three blood  samples will be done. 3. The test takes approximately 2  hours. During the test, do not have anything to  eat or drink. Do not smoke, chew gum (not even sugarless gum) or use breath mints.  4. During the test you should remain close by and seated as much as possible and  avoid walking around. You may want to bring a book or something else to  occupy your time.  5. After your test, you may eat and drink as normal. You may want to bring a snack  to eat after the test is finished. Your provider will advise you as to the results of  this test and any follow-up if necessary  If your sugar test is positive for gestational diabetes, you will be given an phone call and further instructions discussed. If you wish to know all of your test results before your next appointment, feel free to call the office, or look up your test results on Mychart.  (The range that the lab uses for normal values of the sugar test are not necessarily the range that is used for pregnant women; if your results are within the normal range, they are definitely normal.  However, if a value is deemed "high" by the lab, it may not be too high for a pregnant woman.  We will need to discuss the results if your value(s) fall in the "high" category).     Tdap Vaccine  It is recommended that you get the Tdap vaccine during the  third trimester of EACH pregnancy to help protect your baby from getting pertussis (whooping cough)  27-36 weeks is the BEST time to do this so that you can pass the protection on to your baby. During pregnancy is better than after pregnancy, but if you are unable to get it during pregnancy it will be offered at the hospital.  You can get this vaccine at the health department or your family doctor, as well as some pharmacies.  Everyone who will be around your baby should also be up-to-date on their vaccines. Adults (who are not pregnant) only need 1 dose of Tdap during adulthood.      Bacterial Vaginosis Bacterial vaginosis is a vaginal infection that occurs when the normal balance of bacteria in the vagina is disrupted. It results from an overgrowth of certain bacteria. This is the most common vaginal infection among women ages 67-44. Because bacterial vaginosis increases your risk for STIs (sexually transmitted infections), getting treated can help reduce your risk for chlamydia, gonorrhea, herpes, and HIV (human immunodeficiency virus). Treatment is also important for preventing complications in pregnant women, because this condition can cause an early (premature) delivery. What are the causes? This condition is caused by an increase in harmful bacteria that are normally present in small amounts in the vagina.  However, the reason that the condition develops is not fully understood. What increases the risk? The following factors may make you more likely to develop this condition:  Having a new sexual partner or multiple sexual partners.  Having unprotected sex.  Douching.  Having an intrauterine device (IUD).  Smoking.  Drug and alcohol abuse.  Taking certain antibiotic medicines.  Being pregnant. You cannot get bacterial vaginosis from toilet seats, bedding, swimming pools, or contact with objects around you. What are the signs or symptoms? Symptoms of this condition  include:  Grey or white vaginal discharge. The discharge can also be watery or foamy.  A fish-like odor with discharge, especially after sexual intercourse or during menstruation.  Itching in and around the vagina.  Burning or pain with urination. Some women with bacterial vaginosis have no signs or symptoms. How is this diagnosed? This condition is diagnosed based on:  Your medical history.  A physical exam of the vagina.  Testing a sample of vaginal fluid under a microscope to look for a large amount of bad bacteria or abnormal cells. Your health care provider may use a cotton swab or a small wooden spatula to collect the sample. How is this treated? This condition is treated with antibiotics. These may be given as a pill, a vaginal cream, or a medicine that is put into the vagina (suppository). If the condition comes back after treatment, a second round of antibiotics may be needed. Follow these instructions at home: Medicines  Take over-the-counter and prescription medicines only as told by your health care provider.  Take or use your antibiotic as told by your health care provider. Do not stop taking or using the antibiotic even if you start to feel better. General instructions  If you have a female sexual partner, tell her that you have a vaginal infection. She should see her health care provider and be treated if she has symptoms. If you have a female sexual partner, he does not need treatment.  During treatment:  Avoid sexual activity until you finish treatment.  Do not douche.  Avoid alcohol as directed by your health care provider.  Avoid breastfeeding as directed by your health care provider.  Drink enough water and fluids to keep your urine clear or pale yellow.  Keep the area around your vagina and rectum clean.  Wash the area daily with warm water.  Wipe yourself from front to back after using the toilet.  Keep all follow-up visits as told by your health  care provider. This is important. How is this prevented?  Do not douche.  Wash the outside of your vagina with warm water only.  Use protection when having sex. This includes latex condoms and dental dams.  Limit how many sexual partners you have. To help prevent bacterial vaginosis, it is best to have sex with just one partner (monogamous).  Make sure you and your sexual partner are tested for STIs.  Wear cotton or cotton-lined underwear.  Avoid wearing tight pants and pantyhose, especially during summer.  Limit the amount of alcohol that you drink.  Do not use any products that contain nicotine or tobacco, such as cigarettes and e-cigarettes. If you need help quitting, ask your health care provider.  Do not use illegal drugs. Where to find more information:  Centers for Disease Control and Prevention: SolutionApps.co.zawww.cdc.gov/std  American Sexual Health Association (ASHA): www.ashastd.org  [place] Department of Health and Health and safety inspectorHuman Services, Office on Women's Health: ConventionalMedicines.siwww.womenshealth.gov/ or http://www.anderson-williamson.info/https://www.womenshealth.gov/a-z-topics/bacterial-vaginosis Contact a health care provider  if:  Your symptoms do not improve, even after treatment.  You have more discharge or pain when urinating.  You have a fever.  You have pain in your abdomen.  You have pain during sex.  You have vaginal bleeding between periods. Summary  Bacterial vaginosis is a vaginal infection that occurs when the normal balance of bacteria in the vagina is disrupted.  Because bacterial vaginosis increases your risk for STIs (sexually transmitted infections), getting treated can help reduce your risk for chlamydia, gonorrhea, herpes, and HIV (human immunodeficiency virus). Treatment is also important for preventing complications in pregnant women, because the condition can cause an early (premature) delivery.  This condition is treated with antibiotic medicines. These may be given as a pill, a vaginal cream, or a medicine  that is put into the vagina (suppository). This information is not intended to replace advice given to you by your health care provider. Make sure you discuss any questions you have with your health care provider. Document Released: 11/12/2005 Document Revised: 07/28/2016 Document Reviewed: 07/28/2016 Elsevier Interactive Patient Education  2017 [employer]

## 2016-10-09 NOTE — Progress Notes (Signed)
G2P1001 2465w0d Estimated Date of Delivery: 02/05/17  Blood pressure 92/62, pulse 74, weight 115 lb 12.8 oz (52.5 kg), last menstrual period 04/10/2016.   BP weight and urine results all reviewed and noted.  Please refer to the obstetrical flow sheet for the fundal height and fetal heart rate documentation:  Patient reports good fetal movement, denies any bleeding and no rupture of membranes symptoms or regular contractions. Patient C/O some vulvar itch for about a week.  Has been using nystatin cream, helping some but not much. Vulva sl red. SSE;  Thin white dc, sl aminie odor.  Wet prep neg trich/WBC/yeast.  + clue All questions were answered.  Orders Placed This Encounter  Procedures  . POCT urinalysis dipstick    Plan:  Continued routine obstetrical care, rx metrogel qhs X5  Return in about 4 weeks (around 11/06/2016) for PN2/LROB.

## 2016-10-12 ENCOUNTER — Telehealth: Payer: Self-pay | Admitting: Obstetrics & Gynecology

## 2016-10-12 NOTE — Telephone Encounter (Signed)
Pt c/o "beeping sound in left ear last night, not hearing the beeping sound this am." Pt states she did not go to work because she was not able to get much sleep. Pt states she is concerned "never had this happen before." Pt has no other complaints. Please advise.

## 2016-10-12 NOTE — Telephone Encounter (Signed)
Wouldn't be too concerned sounds like she had an episode of tinnitis or ringing in the ears Just monitor generally not much to be done,  Does she have a stuffy head or cold, if so use alka seltzer plus day night formula

## 2016-10-12 NOTE — Telephone Encounter (Signed)
Pt informed of Dr.Eure recommendation and pt verbalized understanding.

## 2016-10-17 ENCOUNTER — Telehealth: Payer: Self-pay | Admitting: Advanced Practice Midwife

## 2016-10-17 ENCOUNTER — Other Ambulatory Visit: Payer: Self-pay | Admitting: Advanced Practice Midwife

## 2016-10-17 MED ORDER — TERCONAZOLE 0.4 % VA CREA: 1.0000 | TOPICAL_CREAM | Freq: Every day | VAGINAL | 0 refills | Status: DC

## 2016-10-17 NOTE — Telephone Encounter (Signed)
I sent a rx for terconozole cream--use it inside and outside where the itch is for 7 days.  If this does not improve itching, make an appt for another exam/slide.  Drenda FreezeFran

## 2016-10-17 NOTE — Progress Notes (Signed)
Still itching after metrogel. Will try teconozole.  F/U if not improved

## 2016-10-17 NOTE — Telephone Encounter (Signed)
Patient called stating she still has having vaginal itching even after using the Metrogel for 5 days. Please advise.

## 2016-10-17 NOTE — Telephone Encounter (Signed)
Called patient to let her know prescription was sent to pharmacy. Instructions given per Fran's note. Patient to make appt to be rechecked if itching does not improve in 7 days. Patient verbalized understanding.

## 2016-10-17 NOTE — Telephone Encounter (Signed)
Patient thinks she has a yeast and wants to know if terconazole will help. Advised patient to take terazol cream for 7 days and to call back if symptoms persist.

## 2016-10-25 ENCOUNTER — Encounter: Payer: Self-pay | Admitting: Advanced Practice Midwife

## 2016-10-25 ENCOUNTER — Ambulatory Visit (INDEPENDENT_AMBULATORY_CARE_PROVIDER_SITE_OTHER): Payer: Medicaid Other | Admitting: Advanced Practice Midwife

## 2016-10-25 VITALS — BP 92/58 | HR 70 | Wt 117.0 lb

## 2016-10-25 DIAGNOSIS — B3731 Acute candidiasis of vulva and vagina: Secondary | ICD-10-CM | POA: Insufficient documentation

## 2016-10-25 DIAGNOSIS — Z3A25 25 weeks gestation of pregnancy: Secondary | ICD-10-CM

## 2016-10-25 DIAGNOSIS — B373 Candidiasis of vulva and vagina: Secondary | ICD-10-CM

## 2016-10-25 DIAGNOSIS — Z3482 Encounter for supervision of other normal pregnancy, second trimester: Secondary | ICD-10-CM

## 2016-10-25 DIAGNOSIS — O23592 Infection of other part of genital tract in pregnancy, second trimester: Secondary | ICD-10-CM

## 2016-10-25 NOTE — Patient Instructions (Signed)
Gentian Violet  1. Before your test, do not eat or drink anything for 8-10 hours prior to your  appointment (a small amount of water is allowed and you may take any medicines you normally take). Be sure to drink lots of water the day before. 2. When you arrive, your blood will be drawn for a 'fasting' blood sugar level.  Then you will be given a sweetened carbonated beverage to drink. You should  complete drinking this beverage within five minutes. After finishing the  beverage, you will have your blood drawn exactly 1 and 2 hours later. Having  your blood drawn on time is an important part of this test. A total of three blood  samples will be done. 3. The test takes approximately 2  hours. During the test, do not have anything to  eat or drink. Do not smoke, chew gum (not even sugarless gum) or use breath mints.  4. During the test you should remain close by and seated as much as possible and  avoid walking around. You may want to bring a book or something else to  occupy your time.  5. After your test, you may eat and drink as normal. You may want to bring a snack  to eat after the test is finished. Your provider will advise you as to the results of  this test and any follow-up if necessary  If your sugar test is positive for gestational diabetes, you will be given an phone call and further instructions discussed. If you wish to know all of your test results before your next appointment, feel free to call the office, or look up your test results on Mychart.  (The range that the lab uses for normal values of the sugar test are not necessarily the range that is used for pregnant women; if your results are within the normal range, they are definitely normal.  However, if a value is deemed "high" by the lab, it may not be too high for a pregnant woman.  We will need to discuss the results if your value(s) fall in the "high" category).     Tdap Vaccine  It is recommended that you get the Tdap  vaccine during the third trimester of EACH pregnancy to help protect your baby from getting pertussis (whooping cough)  27-36 weeks is the BEST time to do this so that you can pass the protection on to your baby. During pregnancy is better than after pregnancy, but if you are unable to get it during pregnancy it will be offered at the hospital.  You can get this vaccine at the health department or your family doctor, as well as some pharmacies.  Everyone who will be around your baby should also be up-to-date on their vaccines. Adults (who are not pregnant) only need 1 dose of Tdap during adulthood.

## 2016-10-25 NOTE — Progress Notes (Signed)
G2P1001 56106w2d Estimated Date of Delivery: 02/05/17  Blood pressure (!) 92/58, pulse 70, weight 117 lb (53.1 kg), last menstrual period 04/10/2016.   BP weight and urine results all reviewed and noted.  Please refer to the obstetrical flow sheet for the fundal height and fetal heart rate documentation:  Patient reports good fetal movement, denies any bleeding and no rupture of membranes symptoms or regular contractions. Sytill having vulvar itch. Uses nystatin cream it helps. No internal sx.  Vulva and rctum painted w/gentian violet.  May consider using nystating 2-3 times a week prophylactically All questions were answered.  No orders of the defined types were placed in this encounter.   Plan:  Continued routine obstetrical care,   Return in about 3 weeks (around 11/15/2016) for PN2/LROB.

## 2016-11-01 ENCOUNTER — Other Ambulatory Visit: Payer: Self-pay | Admitting: Advanced Practice Midwife

## 2016-11-01 ENCOUNTER — Telehealth: Payer: Self-pay | Admitting: Advanced Practice Midwife

## 2016-11-01 MED ORDER — FLUOCINONIDE-E 0.05 % EX CREA: 1.0000 "application " | TOPICAL_CREAM | Freq: Two times a day (BID) | CUTANEOUS | 0 refills | Status: DC

## 2016-11-01 NOTE — Telephone Encounter (Signed)
Patient called stating the Gentian Violet that you applied last week did not help and was told to let you know if it didn't get better. She is still having the same symptoms. Please advise.

## 2016-11-01 NOTE — Telephone Encounter (Signed)
I discussed w/Dr Despina HiddenEure and he suggested trying a topical steroid. I sent a rx to pharmacy. Apply small amunut tiwce a day.  Has appt next week.

## 2016-11-01 NOTE — Progress Notes (Signed)
Still has vulvar itch.  Discessed w/LHE.  Try Lidex BID for about a week

## 2016-11-06 ENCOUNTER — Other Ambulatory Visit: Payer: Medicaid Other

## 2016-11-06 ENCOUNTER — Ambulatory Visit (INDEPENDENT_AMBULATORY_CARE_PROVIDER_SITE_OTHER): Payer: Medicaid Other | Admitting: Women's Health

## 2016-11-06 VITALS — BP 102/60 | HR 82 | Wt 119.6 lb

## 2016-11-06 DIAGNOSIS — Z1389 Encounter for screening for other disorder: Secondary | ICD-10-CM

## 2016-11-06 DIAGNOSIS — Z3A27 27 weeks gestation of pregnancy: Secondary | ICD-10-CM

## 2016-11-06 DIAGNOSIS — Z131 Encounter for screening for diabetes mellitus: Secondary | ICD-10-CM

## 2016-11-06 DIAGNOSIS — Z331 Pregnant state, incidental: Secondary | ICD-10-CM

## 2016-11-06 DIAGNOSIS — Z3482 Encounter for supervision of other normal pregnancy, second trimester: Secondary | ICD-10-CM

## 2016-11-06 DIAGNOSIS — Z3483 Encounter for supervision of other normal pregnancy, third trimester: Secondary | ICD-10-CM

## 2016-11-06 NOTE — Progress Notes (Signed)
Low-risk OB appointment G2P1001 6545w0d Estimated Date of Delivery: 02/05/17 BP 102/60   Pulse 82   Wt 119 lb 9.6 oz (54.3 kg)   LMP 04/10/2016   BMI 24.16 kg/m   BP, weight reviewed.  Unable to void, will try again before she leaves. Refer to obstetrical flow sheet for FH & FHR.  Reports good fm.  Denies regular uc's, lof, vb, or uti s/s. Insurance won't cover Lidex rx'd by [facility] for vulvar itching. Pt states mycolog helps. To just use mycolog prn.  Reviewed ptl s/s, fkc. Recommended Tdap at HD/PCP per CDC guidelines.  Plan:  Continue routine obstetrical care  F/U in 3wks for OB appointment  PN2 today

## 2016-11-06 NOTE — Patient Instructions (Signed)

## 2016-11-07 LAB — GLUCOSE TOLERANCE, 2 HOURS W/ 1HR
GLUCOSE, 1 HOUR: 108 mg/dL (ref 65–179)
Glucose, 2 hour: 103 mg/dL (ref 65–152)
Glucose, Fasting: 73 mg/dL (ref 65–91)

## 2016-11-07 LAB — CBC
HEMOGLOBIN: 11.6 g/dL (ref 11.1–15.9)
Hematocrit: 33.3 % — ABNORMAL LOW (ref 34.0–46.6)
MCH: 30.9 pg (ref 26.6–33.0)
MCHC: 34.8 g/dL (ref 31.5–35.7)
MCV: 89 fL (ref 79–97)
PLATELETS: 316 10*3/uL (ref 150–379)
RBC: 3.75 x10E6/uL — AB (ref 3.77–5.28)
RDW: 12.6 % (ref 12.3–15.4)
WBC: 8.8 10*3/uL (ref 3.4–10.8)

## 2016-11-07 LAB — ANTIBODY SCREEN: Antibody Screen: NEGATIVE

## 2016-11-07 LAB — HIV ANTIBODY (ROUTINE TESTING W REFLEX): HIV SCREEN 4TH GENERATION: NONREACTIVE

## 2016-11-07 LAB — RPR: RPR: NONREACTIVE

## 2016-11-09 NOTE — Telephone Encounter (Signed)
Called patient to let her know prior authorization was done for fluocinonide cream

## 2016-11-22 ENCOUNTER — Telehealth: Payer: Self-pay | Admitting: Obstetrics & Gynecology

## 2016-11-22 NOTE — Telephone Encounter (Signed)
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 Pt also state that she has been having a lot of hip pain. Please contact pt

## 2016-11-22 NOTE — Telephone Encounter (Signed)
Patient called with complaints of abdominal pressure and hip pain. I informed patient that the ligaments during pregnancy soften and is very normal to have hip pain. She states she has a lot of pressure in her abdomen but not contractions or cramping. Baby is moving normally. I suggested that maybe at her next visit, she could be prescribed a belly band to see if it would help. Patient verbalized understanding.

## 2016-11-28 ENCOUNTER — Encounter: Payer: Self-pay | Admitting: Advanced Practice Midwife

## 2016-11-28 ENCOUNTER — Ambulatory Visit (INDEPENDENT_AMBULATORY_CARE_PROVIDER_SITE_OTHER): Payer: Medicaid Other | Admitting: Advanced Practice Midwife

## 2016-11-28 ENCOUNTER — Encounter (INDEPENDENT_AMBULATORY_CARE_PROVIDER_SITE_OTHER): Payer: Self-pay

## 2016-11-28 VITALS — BP 97/57 | HR 86 | Wt 122.0 lb

## 2016-11-28 DIAGNOSIS — Z331 Pregnant state, incidental: Secondary | ICD-10-CM

## 2016-11-28 DIAGNOSIS — Z1389 Encounter for screening for other disorder: Secondary | ICD-10-CM

## 2016-11-28 DIAGNOSIS — Z3A30 30 weeks gestation of pregnancy: Secondary | ICD-10-CM

## 2016-11-28 DIAGNOSIS — Z3483 Encounter for supervision of other normal pregnancy, third trimester: Secondary | ICD-10-CM

## 2016-11-28 LAB — POCT URINALYSIS DIPSTICK
Glucose, UA: NEGATIVE
Ketones, UA: NEGATIVE
NITRITE UA: NEGATIVE
Protein, UA: NEGATIVE

## 2016-11-28 NOTE — Progress Notes (Signed)
G2P1001 6129w1d Estimated Date of Delivery: 02/05/17  Blood pressure (!) 97/57, pulse 86, weight 122 lb (55.3 kg), last menstrual period 04/10/2016.   BP weight and urine results all reviewed and noted.  Please refer to the obstetrical flow sheet for the fundal height and fetal heart rate documentation:  Patient reports good fetal movement, denies any bleeding and no rupture of membranes symptoms or regular contractions. Patient states mycolog is still helping vulvar itch.  C/O ligament pain/pressure. Has maternity belt.  Wants note for decreased work hours.   All questions were answered.  Orders Placed This Encounter  Procedures  . POCT urinalysis dipstick    Plan:  Continued routine obstetrical care,   No Follow-up on file.

## 2016-11-28 NOTE — Patient Instructions (Signed)
Third Trimester of Pregnancy The third trimester is from week 29 through week 40 (months 7 through 9). The third trimester is a time when the unborn baby (fetus) is growing rapidly. At the end of the ninth month, the fetus is about 20 inches in length and weighs 6-10 pounds. Body changes during your third trimester Your body goes through many changes during pregnancy. The changes vary from woman to woman. During the third trimester:  Your weight will continue to increase. You can expect to gain 25-35 pounds (11-16 kg) by the end of the pregnancy.  You may begin to get stretch marks on your hips, abdomen, and breasts.  You may urinate more often because the fetus is moving lower into your pelvis and pressing on your bladder.  You may develop or continue to have heartburn. This is caused by increased hormones that slow down muscles in the digestive tract.  You may develop or continue to have constipation because increased hormones slow digestion and cause the muscles that push waste through your intestines to relax.  You may develop hemorrhoids. These are swollen veins (varicose veins) in the rectum that can itch or be painful.  You may develop swollen, bulging veins (varicose veins) in your legs.  You may have increased body aches in the pelvis, back, or thighs. This is due to weight gain and increased hormones that are relaxing your joints.  You may have changes in your hair. These can include thickening of your hair, rapid growth, and changes in texture. Some women also have hair loss during or after pregnancy, or hair that feels dry or thin. Your hair will most likely return to normal after your baby is born.  Your breasts will continue to grow and they will continue to become tender. A yellow fluid (colostrum) may leak from your breasts. This is the first milk you are producing for your baby.  Your belly button may stick out.  You may notice more swelling in your hands, face, or  ankles.  You may have increased tingling or numbness in your hands, arms, and legs. The skin on your belly may also feel numb.  You may feel short of breath because of your expanding uterus.  You may have more problems sleeping. This can be caused by the size of your belly, increased need to urinate, and an increase in your body's metabolism.  You may notice the fetus "dropping," or moving lower in your abdomen.  You may have increased vaginal discharge.  Your cervix becomes thin and soft (effaced) near your due date. What to expect at prenatal visits You will have prenatal exams every 2 weeks until week 36. Then you will have weekly prenatal exams. During a routine prenatal visit:  You will be weighed to make sure you and the fetus are growing normally.  Your blood pressure will be taken.  Your abdomen will be measured to track your baby's growth.  The fetal heartbeat will be listened to.  Any test results from the previous visit will be discussed.  You may have a cervical check near your due date to see if you have effaced. At around 36 weeks, your health care provider will check your cervix. At the same time, your health care provider will also perform a test on the secretions of the vaginal tissue. This test is to determine if a type of bacteria, Group B streptococcus, is present. Your health care provider will explain this further. Your health care provider may ask you:    What your birth plan is.  How you are feeling.  If you are feeling the baby move.  If you have had any abnormal symptoms, such as leaking fluid, bleeding, severe headaches, or abdominal cramping.  If you are using any tobacco products, including cigarettes, chewing tobacco, and electronic cigarettes.  If you have any questions. Other tests or screenings that may be performed during your third trimester include:  Blood tests that check for low iron levels (anemia).  Fetal testing to check the health,  activity level, and growth of the fetus. Testing is done if you have certain medical conditions or if there are problems during the pregnancy.  Nonstress test (NST). This test checks the health of your baby to make sure there are no signs of problems, such as the baby not getting enough oxygen. During this test, a belt is placed around your belly. The baby is made to move, and its heart rate is monitored during movement. What is false labor? False labor is a condition in which you feel small, irregular tightenings of the muscles in the womb (contractions) that eventually go away. These are called Braxton Hicks contractions. Contractions may last for hours, days, or even weeks before true labor sets in. If contractions come at regular intervals, become more frequent, increase in intensity, or become painful, you should see your health care provider. What are the signs of labor?  Abdominal cramps.  Regular contractions that start at 10 minutes apart and become stronger and more frequent with time.  Contractions that start on the top of the uterus and spread down to the lower abdomen and back.  Increased pelvic pressure and dull back pain.  A watery or bloody mucus discharge that comes from the vagina.  Leaking of amniotic fluid. This is also known as your "water breaking." It could be a slow trickle or a gush. Let your doctor know if it has a color or strange odor. If you have any of these signs, call your health care provider right away, even if it is before your due date. Follow these instructions at home: Eating and drinking  Continue to eat regular, healthy meals.  Do not eat:  Raw meat or meat spreads.  Unpasteurized milk or cheese.  Unpasteurized juice.  Store-made salad.  Refrigerated smoked seafood.  Hot dogs or deli meat, unless they are piping hot.  More than 6 ounces of albacore tuna a week.  Shark, swordfish, king mackerel, or tile fish.  Store-made salads.  Raw  sprouts, such as mung bean or alfalfa sprouts.  Take prenatal vitamins as told by your health care provider.  Take 1000 mg of calcium daily as told by your health care provider.  If you develop constipation:  Take over-the-counter or prescription medicines.  Drink enough fluid to keep your urine clear or pale yellow.  Eat foods that are high in fiber, such as fresh fruits and vegetables, whole grains, and beans.  Limit foods that are high in fat and processed sugars, such as fried and sweet foods. Activity  Exercise only as directed by your health care provider. Healthy pregnant women should aim for 2 hours and 30 minutes of moderate exercise per week. If you experience any pain or discomfort while exercising, stop.  Avoid heavy lifting.  Do not exercise in extreme heat or humidity, or at high altitudes.  Wear low-heel, comfortable shoes.  Practice good posture.  Do not travel far distances unless it is absolutely necessary and only with the approval   of your health care provider.  Wear your seat belt at all times while in a car, on a bus, or on a plane.  Take frequent breaks and rest with your legs elevated if you have leg cramps or low back pain.  Do not use hot tubs, steam rooms, or saunas.  You may continue to have sex unless your health care provider tells you otherwise. Lifestyle  Do not use any products that contain nicotine or tobacco, such as cigarettes and e-cigarettes. If you need help quitting, ask your health care provider.  Do not drink alcohol.  Do not use any medicinal herbs or unprescribed drugs. These chemicals affect the formation and growth of the baby.  If you develop varicose veins:  Wear support pantyhose or compression stockings as told by your healthcare provider.  Elevate your feet for 15 minutes, 3-4 times a day.  Wear a supportive maternity bra to help with breast tenderness. General instructions  Take over-the-counter and prescription  medicines only as told by your health care provider. There are medicines that are either safe or unsafe to take during pregnancy.  Take warm sitz baths to soothe any pain or discomfort caused by hemorrhoids. Use hemorrhoid cream or witch hazel if your health care provider approves.  Avoid cat litter boxes and soil used by cats. These carry germs that can cause birth defects in the baby. If you have a cat, ask someone to clean the litter box for you.  To prepare for the arrival of your baby:  Take prenatal classes to understand, practice, and ask questions about the labor and delivery.  Make a trial run to the hospital.  Visit the hospital and tour the maternity area.  Arrange for maternity or paternity leave through employers.  Arrange for family and friends to take care of pets while you are in the hospital.  Purchase a rear-facing car seat and make sure you know how to install it in your car.  Pack your hospital bag.  Prepare the baby's nursery. Make sure to remove all pillows and stuffed animals from the baby's crib to prevent suffocation.  Visit your dentist if you have not gone during your pregnancy. Use a soft toothbrush to brush your teeth and be gentle when you floss.  Keep all prenatal follow-up visits as told by your health care provider. This is important. Contact a health care provider if:  You are unsure if you are in labor or if your water has broken.  You become dizzy.  You have mild pelvic cramps, pelvic pressure, or nagging pain in your abdominal area.  You have lower back pain.  You have persistent nausea, vomiting, or diarrhea.  You have an unusual or bad smelling vaginal discharge.  You have pain when you urinate. Get help right away if:  You have a fever.  You are leaking fluid from your vagina.  You have spotting or bleeding from your vagina.  You have severe abdominal pain or cramping.  You have rapid weight loss or weight gain.  You have  shortness of breath with chest pain.  You notice sudden or extreme swelling of your face, hands, ankles, feet, or legs.  Your baby makes fewer than 10 movements in 2 hours.  You have severe headaches that do not go away with medicine.  You have vision changes. Summary  The third trimester is from week 29 through week 40, months 7 through 9. The third trimester is a time when the unborn baby (fetus)   is growing rapidly.  During the third trimester, your discomfort may increase as you and your baby continue to gain weight. You may have abdominal, leg, and back pain, sleeping problems, and an increased need to urinate.  During the third trimester your breasts will keep growing and they will continue to become tender. A yellow fluid (colostrum) may leak from your breasts. This is the first milk you are producing for your baby.  False labor is a condition in which you feel small, irregular tightenings of the muscles in the womb (contractions) that eventually go away. These are called Braxton Hicks contractions. Contractions may last for hours, days, or even weeks before true labor sets in.  Signs of labor can include: abdominal cramps; regular contractions that start at 10 minutes apart and become stronger and more frequent with time; watery or bloody mucus discharge that comes from the vagina; increased pelvic pressure and dull back pain; and leaking of amniotic fluid. This information is not intended to replace advice given to you by your health care provider. Make sure you discuss any questions you have with your health care provider. Document Released: 11/06/2001 Document Revised: 04/19/2016 Document Reviewed: 01/13/2013 Elsevier Interactive Patient Education  2017 [employer] Inc[employer]  

## 2016-12-04 ENCOUNTER — Other Ambulatory Visit: Payer: Self-pay | Admitting: Obstetrics & Gynecology

## 2016-12-05 ENCOUNTER — Other Ambulatory Visit: Payer: Self-pay | Admitting: Obstetrics & Gynecology

## 2016-12-10 ENCOUNTER — Telehealth: Payer: Self-pay | Admitting: *Deleted

## 2016-12-10 NOTE — Telephone Encounter (Signed)
Patient complaining of vaginal irritation and itching. States she feels as though she is getting another yeast infection. I advised patient that since she has not been checked for a yeast infection since 11/14 she would need to be examined. Pt ha appointment on 1/17. Advised to keep that appointment. Pt verbalized understanding

## 2016-12-11 ENCOUNTER — Encounter (INDEPENDENT_AMBULATORY_CARE_PROVIDER_SITE_OTHER): Payer: Medicaid Other | Admitting: Advanced Practice Midwife

## 2016-12-11 ENCOUNTER — Encounter: Payer: Self-pay | Admitting: Advanced Practice Midwife

## 2016-12-11 ENCOUNTER — Ambulatory Visit (INDEPENDENT_AMBULATORY_CARE_PROVIDER_SITE_OTHER): Payer: Medicaid Other | Admitting: Advanced Practice Midwife

## 2016-12-11 VITALS — BP 85/49 | HR 91 | Wt 128.0 lb

## 2016-12-11 DIAGNOSIS — N898 Other specified noninflammatory disorders of vagina: Secondary | ICD-10-CM | POA: Diagnosis not present

## 2016-12-11 DIAGNOSIS — Z331 Pregnant state, incidental: Secondary | ICD-10-CM

## 2016-12-11 DIAGNOSIS — Z3483 Encounter for supervision of other normal pregnancy, third trimester: Secondary | ICD-10-CM

## 2016-12-11 DIAGNOSIS — Z1389 Encounter for screening for other disorder: Secondary | ICD-10-CM

## 2016-12-11 DIAGNOSIS — L299 Pruritus, unspecified: Secondary | ICD-10-CM

## 2016-12-11 DIAGNOSIS — Z3A32 32 weeks gestation of pregnancy: Secondary | ICD-10-CM

## 2016-12-11 LAB — POCT URINALYSIS DIPSTICK
Blood, UA: NEGATIVE
GLUCOSE UA: NEGATIVE
Ketones, UA: NEGATIVE
NITRITE UA: NEGATIVE

## 2016-12-11 MED ORDER — METRONIDAZOLE 0.75 % VA GEL
1.0000 | Freq: Every day | VAGINAL | 1 refills | Status: DC
Start: 2016-12-11 — End: 2017-02-01

## 2016-12-11 NOTE — Patient Instructions (Signed)
Third Trimester of Pregnancy The third trimester is from week 29 through week 40 (months 7 through 9). The third trimester is a time when the unborn baby (fetus) is growing rapidly. At the end of the ninth month, the fetus is about 20 inches in length and weighs 6-10 pounds. Body changes during your third trimester Your body goes through many changes during pregnancy. The changes vary from woman to woman. During the third trimester:  Your weight will continue to increase. You can expect to gain 25-35 pounds (11-16 kg) by the end of the pregnancy.  You may begin to get stretch marks on your hips, abdomen, and breasts.  You may urinate more often because the fetus is moving lower into your pelvis and pressing on your bladder.  You may develop or continue to have heartburn. This is caused by increased hormones that slow down muscles in the digestive tract.  You may develop or continue to have constipation because increased hormones slow digestion and cause the muscles that push waste through your intestines to relax.  You may develop hemorrhoids. These are swollen veins (varicose veins) in the rectum that can itch or be painful.  You may develop swollen, bulging veins (varicose veins) in your legs.  You may have increased body aches in the pelvis, back, or thighs. This is due to weight gain and increased hormones that are relaxing your joints.  You may have changes in your hair. These can include thickening of your hair, rapid growth, and changes in texture. Some women also have hair loss during or after pregnancy, or hair that feels dry or thin. Your hair will most likely return to normal after your baby is born.  Your breasts will continue to grow and they will continue to become tender. A yellow fluid (colostrum) may leak from your breasts. This is the first milk you are producing for your baby.  Your belly button may stick out.  You may notice more swelling in your hands, face, or  ankles.  You may have increased tingling or numbness in your hands, arms, and legs. The skin on your belly may also feel numb.  You may feel short of breath because of your expanding uterus.  You may have more problems sleeping. This can be caused by the size of your belly, increased need to urinate, and an increase in your body's metabolism.  You may notice the fetus "dropping," or moving lower in your abdomen.  You may have increased vaginal discharge.  Your cervix becomes thin and soft (effaced) near your due date. What to expect at prenatal visits You will have prenatal exams every 2 weeks until week 36. Then you will have weekly prenatal exams. During a routine prenatal visit:  You will be weighed to make sure you and the fetus are growing normally.  Your blood pressure will be taken.  Your abdomen will be measured to track your baby's growth.  The fetal heartbeat will be listened to.  Any test results from the previous visit will be discussed.  You may have a cervical check near your due date to see if you have effaced. At around 36 weeks, your health care provider will check your cervix. At the same time, your health care provider will also perform a test on the secretions of the vaginal tissue. This test is to determine if a type of bacteria, Group B streptococcus, is present. Your health care provider will explain this further. Your health care provider may ask you:    What your birth plan is.  How you are feeling.  If you are feeling the baby move.  If you have had any abnormal symptoms, such as leaking fluid, bleeding, severe headaches, or abdominal cramping.  If you are using any tobacco products, including cigarettes, chewing tobacco, and electronic cigarettes.  If you have any questions. Other tests or screenings that may be performed during your third trimester include:  Blood tests that check for low iron levels (anemia).  Fetal testing to check the health,  activity level, and growth of the fetus. Testing is done if you have certain medical conditions or if there are problems during the pregnancy.  Nonstress test (NST). This test checks the health of your baby to make sure there are no signs of problems, such as the baby not getting enough oxygen. During this test, a belt is placed around your belly. The baby is made to move, and its heart rate is monitored during movement. What is false labor? False labor is a condition in which you feel small, irregular tightenings of the muscles in the womb (contractions) that eventually go away. These are called Braxton Hicks contractions. Contractions may last for hours, days, or even weeks before true labor sets in. If contractions come at regular intervals, become more frequent, increase in intensity, or become painful, you should see your health care provider. What are the signs of labor?  Abdominal cramps.  Regular contractions that start at 10 minutes apart and become stronger and more frequent with time.  Contractions that start on the top of the uterus and spread down to the lower abdomen and back.  Increased pelvic pressure and dull back pain.  A watery or bloody mucus discharge that comes from the vagina.  Leaking of amniotic fluid. This is also known as your "water breaking." It could be a slow trickle or a gush. Let your doctor know if it has a color or strange odor. If you have any of these signs, call your health care provider right away, even if it is before your due date. Follow these instructions at home: Eating and drinking  Continue to eat regular, healthy meals.  Do not eat:  Raw meat or meat spreads.  Unpasteurized milk or cheese.  Unpasteurized juice.  Store-made salad.  Refrigerated smoked seafood.  Hot dogs or deli meat, unless they are piping hot.  More than 6 ounces of albacore tuna a week.  Shark, swordfish, king mackerel, or tile fish.  Store-made salads.  Raw  sprouts, such as mung bean or alfalfa sprouts.  Take prenatal vitamins as told by your health care provider.  Take 1000 mg of calcium daily as told by your health care provider.  If you develop constipation:  Take over-the-counter or prescription medicines.  Drink enough fluid to keep your urine clear or pale yellow.  Eat foods that are high in fiber, such as fresh fruits and vegetables, whole grains, and beans.  Limit foods that are high in fat and processed sugars, such as fried and sweet foods. Activity  Exercise only as directed by your health care provider. Healthy pregnant women should aim for 2 hours and 30 minutes of moderate exercise per week. If you experience any pain or discomfort while exercising, stop.  Avoid heavy lifting.  Do not exercise in extreme heat or humidity, or at high altitudes.  Wear low-heel, comfortable shoes.  Practice good posture.  Do not travel far distances unless it is absolutely necessary and only with the approval   of your health care provider.  Wear your seat belt at all times while in a car, on a bus, or on a plane.  Take frequent breaks and rest with your legs elevated if you have leg cramps or low back pain.  Do not use hot tubs, steam rooms, or saunas.  You may continue to have sex unless your health care provider tells you otherwise. Lifestyle  Do not use any products that contain nicotine or tobacco, such as cigarettes and e-cigarettes. If you need help quitting, ask your health care provider.  Do not drink alcohol.  Do not use any medicinal herbs or unprescribed drugs. These chemicals affect the formation and growth of the baby.  If you develop varicose veins:  Wear support pantyhose or compression stockings as told by your healthcare provider.  Elevate your feet for 15 minutes, 3-4 times a day.  Wear a supportive maternity bra to help with breast tenderness. General instructions  Take over-the-counter and prescription  medicines only as told by your health care provider. There are medicines that are either safe or unsafe to take during pregnancy.  Take warm sitz baths to soothe any pain or discomfort caused by hemorrhoids. Use hemorrhoid cream or witch hazel if your health care provider approves.  Avoid cat litter boxes and soil used by cats. These carry germs that can cause birth defects in the baby. If you have a cat, ask someone to clean the litter box for you.  To prepare for the arrival of your baby:  Take prenatal classes to understand, practice, and ask questions about the labor and delivery.  Make a trial run to the hospital.  Visit the hospital and tour the maternity area.  Arrange for maternity or paternity leave through employers.  Arrange for family and friends to take care of pets while you are in the hospital.  Purchase a rear-facing car seat and make sure you know how to install it in your car.  Pack your hospital bag.  Prepare the baby's nursery. Make sure to remove all pillows and stuffed animals from the baby's crib to prevent suffocation.  Visit your dentist if you have not gone during your pregnancy. Use a soft toothbrush to brush your teeth and be gentle when you floss.  Keep all prenatal follow-up visits as told by your health care provider. This is important. Contact a health care provider if:  You are unsure if you are in labor or if your water has broken.  You become dizzy.  You have mild pelvic cramps, pelvic pressure, or nagging pain in your abdominal area.  You have lower back pain.  You have persistent nausea, vomiting, or diarrhea.  You have an unusual or bad smelling vaginal discharge.  You have pain when you urinate. Get help right away if:  You have a fever.  You are leaking fluid from your vagina.  You have spotting or bleeding from your vagina.  You have severe abdominal pain or cramping.  You have rapid weight loss or weight gain.  You have  shortness of breath with chest pain.  You notice sudden or extreme swelling of your face, hands, ankles, feet, or legs.  Your baby makes fewer than 10 movements in 2 hours.  You have severe headaches that do not go away with medicine.  You have vision changes. Summary  The third trimester is from week 29 through week 40, months 7 through 9. The third trimester is a time when the unborn baby (fetus)   is growing rapidly.  During the third trimester, your discomfort may increase as you and your baby continue to gain weight. You may have abdominal, leg, and back pain, sleeping problems, and an increased need to urinate.  During the third trimester your breasts will keep growing and they will continue to become tender. A yellow fluid (colostrum) may leak from your breasts. This is the first milk you are producing for your baby.  False labor is a condition in which you feel small, irregular tightenings of the muscles in the womb (contractions) that eventually go away. These are called Braxton Hicks contractions. Contractions may last for hours, days, or even weeks before true labor sets in.  Signs of labor can include: abdominal cramps; regular contractions that start at 10 minutes apart and become stronger and more frequent with time; watery or bloody mucus discharge that comes from the vagina; increased pelvic pressure and dull back pain; and leaking of amniotic fluid. This information is not intended to replace advice given to you by your health care provider. Make sure you discuss any questions you have with your health care provider. Document Released: 11/06/2001 Document Revised: 04/19/2016 Document Reviewed: 01/13/2013 Elsevier Interactive Patient Education  2017 [employer] Inc[employer]  

## 2016-12-11 NOTE — Progress Notes (Addendum)
.   G2P1001 6783w0d Estimated Date of Delivery: 02/05/17  Last menstrual period 04/10/2016.   BP weight and urine results all reviewed and noted.  Please refer to the obstetrical flow sheet for the fundal height and fetal heart rate documentation:   Patient reports good fetal movement, denies any bleeding and no rupture of membranes symptoms or regular contractions. Patient feels like she is getting ayeast infection. She has had off and on vulvar itch for months, the only thing htat helps is mycolog cream which she uses prn. SSE:  thin white frothy dc dishcarge.  Wet prep + WBC, no trich, + clue.   Also c/o itching on feel and legs at night, alleived by aveeno All questions were answered.     Orders Placed This Encounter  Procedures  . POCT urinalysis dipstick    Plan:  Continued routine obstetrical care, Metrogel Bile acids when fasting  Return in about 2 weeks (around 12/25/2016) for LROB.

## 2016-12-11 NOTE — Addendum Note (Signed)
Addended by: Jacklyn ShellRESENZO-DISHMON, Aj Crunkleton on: 12/11/2016 11:41 AM   Modules accepted: Orders

## 2016-12-11 NOTE — Progress Notes (Signed)
error 

## 2016-12-12 ENCOUNTER — Ambulatory Visit: Payer: Medicaid Other | Admitting: Advanced Practice Midwife

## 2016-12-19 LAB — BILE ACIDS, TOTAL: Bile Acids Total: 9.8 umol/L (ref 4.7–24.5)

## 2016-12-20 ENCOUNTER — Telehealth: Payer: Self-pay | Admitting: Advanced Practice Midwife

## 2016-12-20 ENCOUNTER — Other Ambulatory Visit: Payer: Self-pay | Admitting: Advanced Practice Midwife

## 2016-12-20 MED ORDER — TERCONAZOLE 0.4 % VA CREA
1.0000 | TOPICAL_CREAM | Freq: Every day | VAGINAL | 0 refills | Status: DC
Start: 2016-12-20 — End: 2017-02-01

## 2016-12-20 NOTE — Progress Notes (Signed)
terconozole d/t pt caling w/thick white dc, itch

## 2016-12-20 NOTE — Telephone Encounter (Signed)
Pt states she was taking Metrogel and seem to improving some, finished it last week, over past few days has become very irritated and itchy and having a lot of thick white d/c.  Pt states she is using the Nystatin cream but not helping with the irritation/itching.  Pt informed to f/u with her pharmacy if she does not hear back from us this afternoon.  Pt verbalized understanding.

## 2016-12-20 NOTE — Telephone Encounter (Signed)
Pt called stating that she would like a call back from the nurse, Pt did not state the reason why. Please contact pt °

## 2016-12-25 ENCOUNTER — Ambulatory Visit (INDEPENDENT_AMBULATORY_CARE_PROVIDER_SITE_OTHER): Payer: Medicaid Other | Admitting: Advanced Practice Midwife

## 2016-12-25 ENCOUNTER — Encounter: Payer: Self-pay | Admitting: Advanced Practice Midwife

## 2016-12-25 VITALS — BP 100/62 | HR 84 | Wt 124.0 lb

## 2016-12-25 DIAGNOSIS — Z331 Pregnant state, incidental: Secondary | ICD-10-CM

## 2016-12-25 DIAGNOSIS — O09293 Supervision of pregnancy with other poor reproductive or obstetric history, third trimester: Secondary | ICD-10-CM

## 2016-12-25 DIAGNOSIS — Z3A34 34 weeks gestation of pregnancy: Secondary | ICD-10-CM

## 2016-12-25 DIAGNOSIS — Z3483 Encounter for supervision of other normal pregnancy, third trimester: Secondary | ICD-10-CM

## 2016-12-25 DIAGNOSIS — Z1389 Encounter for screening for other disorder: Secondary | ICD-10-CM

## 2016-12-25 LAB — POCT URINALYSIS DIPSTICK
GLUCOSE UA: NEGATIVE
Ketones, UA: NEGATIVE
NITRITE UA: NEGATIVE
Protein, UA: NEGATIVE
RBC UA: NEGATIVE

## 2016-12-25 NOTE — Progress Notes (Signed)
G2P1001 8669w0d Estimated Date of Delivery: 02/05/17  Blood pressure 100/62, pulse 84, weight 124 lb (56.2 kg), last menstrual period 04/10/2016.   BP weight and urine results all reviewed and noted.  Please refer to the obstetrical flow sheet for the fundal height and fetal heart rate documentation:  terconozole helped completely!!  Patient reports good fetal movement, denies any bleeding and no rupture of membranes symptoms or regular contractions. Patient is without complaints. All questions were answered.  Orders Placed This Encounter  Procedures  . US OB Follow Up  . POCT urinalysis dipstick    Plan:  Continued routine obstetrical care,   Return in about 2 weeks (around 01/08/2017) for LROB, US:EFW. (hx 4th degree lac w/ 6# 13oz baby)

## 2016-12-25 NOTE — Patient Instructions (Signed)

## 2017-01-10 ENCOUNTER — Encounter: Payer: Self-pay | Admitting: Women's Health

## 2017-01-10 ENCOUNTER — Ambulatory Visit (INDEPENDENT_AMBULATORY_CARE_PROVIDER_SITE_OTHER): Payer: Medicaid Other

## 2017-01-10 ENCOUNTER — Ambulatory Visit (INDEPENDENT_AMBULATORY_CARE_PROVIDER_SITE_OTHER): Payer: Medicaid Other | Admitting: Women's Health

## 2017-01-10 VITALS — BP 100/62 | HR 76 | Wt 127.0 lb

## 2017-01-10 DIAGNOSIS — Z1389 Encounter for screening for other disorder: Secondary | ICD-10-CM

## 2017-01-10 DIAGNOSIS — Z3483 Encounter for supervision of other normal pregnancy, third trimester: Secondary | ICD-10-CM

## 2017-01-10 DIAGNOSIS — B3731 Acute candidiasis of vulva and vagina: Secondary | ICD-10-CM

## 2017-01-10 DIAGNOSIS — Z331 Pregnant state, incidental: Secondary | ICD-10-CM

## 2017-01-10 DIAGNOSIS — L299 Pruritus, unspecified: Secondary | ICD-10-CM

## 2017-01-10 DIAGNOSIS — Z3A36 36 weeks gestation of pregnancy: Secondary | ICD-10-CM | POA: Diagnosis not present

## 2017-01-10 DIAGNOSIS — Z3403 Encounter for supervision of normal first pregnancy, third trimester: Secondary | ICD-10-CM

## 2017-01-10 DIAGNOSIS — B373 Candidiasis of vulva and vagina: Secondary | ICD-10-CM

## 2017-01-10 DIAGNOSIS — O09293 Supervision of pregnancy with other poor reproductive or obstetric history, third trimester: Secondary | ICD-10-CM

## 2017-01-10 DIAGNOSIS — O23593 Infection of other part of genital tract in pregnancy, third trimester: Secondary | ICD-10-CM

## 2017-01-10 LAB — POCT URINALYSIS DIPSTICK
Glucose, UA: NEGATIVE
Ketones, UA: NEGATIVE
NITRITE UA: NEGATIVE
PROTEIN UA: NEGATIVE
RBC UA: NEGATIVE

## 2017-01-10 NOTE — Patient Instructions (Addendum)
Nothing to eat or drink after midnight tonight, come in the morning for labs, they open at 8am  Call the office [phone]) or go to [facility] if:  You begin to have strong, frequent contractions  Your water breaks.  Sometimes it is a big gush of fluid, sometimes it is just a trickle that keeps getting your panties wet or running down your legs  You have vaginal bleeding.  It is normal to have a small amount of spotting if your cervix was checked.   You don't feel your baby moving like normal.  If you don't, get you something to eat and drink and lay down and focus on feeling your baby move.  You should feel at least 10 movements in 2 hours.  If you don't, you should call the office or go to [facility].     Preterm Labor and Birth Information The normal length of a pregnancy is 39-41 weeks. Preterm labor is when labor starts before 37 completed weeks of pregnancy. What are the risk factors for preterm labor? Preterm labor is more likely to occur in women who:  Have certain infections during pregnancy such as a bladder infection, sexually transmitted infection, or infection inside the uterus (chorioamnionitis).  Have a shorter-than-normal cervix.  Have gone into preterm labor before.  Have had surgery on their cervix.  Are younger than age 35 or older than age 27.  Are African American.  Are pregnant with twins or multiple babies (multiple gestation).  Take street drugs or smoke while pregnant.  Do not gain enough weight while pregnant.  Became pregnant shortly after having been pregnant. What are the symptoms of preterm labor? Symptoms of preterm labor include:  Cramps similar to those that can happen during a menstrual period. The cramps may happen with diarrhea.  Pain in the abdomen or lower back.  Regular uterine contractions that may feel like tightening of the abdomen.  A feeling of increased pressure in the pelvis.  Increased watery or bloody mucus  discharge from the vagina.  Water breaking (ruptured amniotic sac). Why is it important to recognize signs of preterm labor? It is important to recognize signs of preterm labor because babies who are born prematurely may not be fully developed. This can put them at an increased risk for:  Long-term (chronic) heart and lung problems.  Difficulty immediately after birth with regulating body systems, including blood sugar, body temperature, heart rate, and breathing rate.  Bleeding in the brain.  Cerebral palsy.  Learning difficulties.  Death. These risks are highest for babies who are born before 34 weeks of pregnancy. How is preterm labor treated? Treatment depends on the length of your pregnancy, your condition, and the health of your baby. It may involve:  Having a stitch (suture) placed in your cervix to prevent your cervix from opening too early (cerclage).  Taking or being given medicines, such as:  Hormone medicines. These may be given early in pregnancy to help support the pregnancy.  Medicine to stop contractions.  Medicines to help mature the baby's lungs. These may be prescribed if the risk of delivery is high.  Medicines to prevent your baby from developing cerebral palsy. If the labor happens before 34 weeks of pregnancy, you may need to stay in the hospital. What should I do if I think I am in preterm labor? If you think that you are going into preterm labor, call your health care provider right away. How can I prevent preterm labor in  future pregnancies? To increase your chance of having a full-term pregnancy:  Do not use any tobacco products, such as cigarettes, chewing tobacco, and e-cigarettes. If you need help quitting, ask your health care provider.  Do not use street drugs or medicines that have not been prescribed to you during your pregnancy.  Talk with your health care provider before taking any herbal supplements, even if you have been taking them  regularly.  Make sure you gain a healthy amount of weight during your pregnancy.  Watch for infection. If you think that you might have an infection, get it checked right away.  Make sure to tell your health care provider if you have gone into preterm labor before. This information is not intended to replace advice given to you by your health care provider. Make sure you discuss any questions you have with your health care provider. Document Released: 02/02/2004 Document Revised: 04/24/2016 Document Reviewed: 04/04/2016 Elsevier Interactive Patient Education  2017 [employer]

## 2017-01-10 NOTE — Progress Notes (Signed)
US 36+2 wks,cephalic,cx 3.3 cm,ant pl gr 2,normal ov's bilat,afi 8.9 cm,fhr 148 bpm,EFW 3022 59%

## 2017-01-10 NOTE — Progress Notes (Signed)
Low-risk OB appointment G2P1001 4817w2d Estimated Date of Delivery: 02/05/17 BP 100/62   Pulse 76   Wt 127 lb (57.6 kg)   LMP 04/10/2016   BMI 25.65 kg/m   BP, weight, and urine reviewed.  Refer to obstetrical flow sheet for FH & FHR.  Reports good fm.  Denies regular uc's, lof, vb, or uti s/s. Still itchy all over, worse at night, not on palms/soles. Bile acids 9.8 on 1/23. Has eaten today. Will come in am for fasting bile acids and cmp. Can take po benadryl, hydrocortisone cream, cool showers/baths/wash cloths, avoid hot Reviewed today's u/s for efw d/t h/o 4th degree lac w/ 6lb13oz baby. EFW today 59%, 6lb6oz- discussed don't typically recommend c/s d/t h/o 4th degree lac, tissues usually give better w/ 2nd birth, not as likely to have 4th degree again- will schedule w/ MD next week to discuss further. Plan:  Continue routine obstetrical care  F/U in am for fasting bile acids/cmp (no visit), then 1wk for OB appointment w/ MD

## 2017-01-11 ENCOUNTER — Other Ambulatory Visit: Payer: Medicaid Other

## 2017-01-13 LAB — COMPREHENSIVE METABOLIC PANEL
ALBUMIN: 3.7 g/dL (ref 3.5–5.5)
ALT: 14 IU/L (ref 0–32)
AST: 20 IU/L (ref 0–40)
Albumin/Globulin Ratio: 1.4 (ref 1.2–2.2)
Alkaline Phosphatase: 128 IU/L — ABNORMAL HIGH (ref 39–117)
BUN / CREAT RATIO: 10 (ref 9–23)
BUN: 4 mg/dL — ABNORMAL LOW (ref 6–20)
Bilirubin Total: 0.2 mg/dL (ref 0.0–1.2)
CALCIUM: 8.7 mg/dL (ref 8.7–10.2)
CO2: 17 mmol/L — AB (ref 18–29)
CREATININE: 0.42 mg/dL — AB (ref 0.57–1.00)
Chloride: 103 mmol/L (ref 96–106)
GFR calc Af Amer: 158 mL/min/{1.73_m2} (ref >59)
GFR, EST NON AFRICAN AMERICAN: 137 mL/min/{1.73_m2} (ref >59)
GLOBULIN, TOTAL: 2.6 g/dL (ref 1.5–4.5)
Glucose: 69 mg/dL (ref 65–99)
Potassium: 4.5 mmol/L (ref 3.5–5.2)
SODIUM: 139 mmol/L (ref 134–144)
Total Protein: 6.3 g/dL (ref 6.0–8.5)

## 2017-01-13 LAB — BILE ACIDS, TOTAL: Bile Acids Total: 6.5 umol/L (ref 4.7–24.5)

## 2017-01-17 ENCOUNTER — Telehealth (HOSPITAL_COMMUNITY): Payer: Self-pay | Admitting: *Deleted

## 2017-01-17 ENCOUNTER — Ambulatory Visit (INDEPENDENT_AMBULATORY_CARE_PROVIDER_SITE_OTHER): Payer: Medicaid Other | Admitting: Obstetrics and Gynecology

## 2017-01-17 VITALS — BP 90/62 | HR 89 | Wt 126.8 lb

## 2017-01-17 DIAGNOSIS — Z3483 Encounter for supervision of other normal pregnancy, third trimester: Secondary | ICD-10-CM

## 2017-01-17 DIAGNOSIS — Z3A37 37 weeks gestation of pregnancy: Secondary | ICD-10-CM

## 2017-01-17 DIAGNOSIS — O99013 Anemia complicating pregnancy, third trimester: Secondary | ICD-10-CM

## 2017-01-17 DIAGNOSIS — Z3493 Encounter for supervision of normal pregnancy, unspecified, third trimester: Secondary | ICD-10-CM

## 2017-01-17 DIAGNOSIS — Z1389 Encounter for screening for other disorder: Secondary | ICD-10-CM

## 2017-01-17 DIAGNOSIS — Z331 Pregnant state, incidental: Secondary | ICD-10-CM

## 2017-01-17 DIAGNOSIS — O09293 Supervision of pregnancy with other poor reproductive or obstetric history, third trimester: Secondary | ICD-10-CM

## 2017-01-17 LAB — POCT URINALYSIS DIPSTICK
GLUCOSE UA: NEGATIVE
KETONES UA: NEGATIVE
Nitrite, UA: NEGATIVE
Protein, UA: NEGATIVE
RBC UA: NEGATIVE

## 2017-01-17 NOTE — Progress Notes (Signed)
G2P1001 6415w2d Estimated Date of Delivery: 02/05/17 LROB  Patient reports good fetal movement, denies any bleeding and no rupture of membranes symptoms or regular contractions. Patient complaints: no complaints at this time. Pt states that her last delivery was 8 years ago and she had a 4th degree laceration that she is worried about. Pt notes that she doesn't wish to have anymore children following this delivery. Pt states that she has a hx of anemia late in pregnancy.  Blood pressure 90/62, pulse 89, weight 126 lb 12.8 oz (57.5 kg), last menstrual period 04/10/2016. refer to the ob flow sheet for FH and FHR, also BP, Wt, Urine results:notable for 2+ leukocytes, otherwise negative.                          Physical Examination:  General appearance - alert, well appearing, and in no distress Abdomen - FH 37 cm                  -FHR 152 Pelvic normal external genitalia, vulva, vagina, cervix, uterus and adnexa,  VULVA: normal appearing vulva with no masses, tenderness or lesions, elastic tissues. moderated VAGINA: normal appearing vagina with normal color and discharge, no lesions,  CERVIX: normal appearing cervix without discharge or lesions,                                               Questions were answered. 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  Plan:  Continued routine obstetrical care, GBS and GC/Chlamydia completed today.  F/u in 1 week for routine OB C-section to be scheduled at 39 weeks. Posted for 12:30 on March 7, for JVF  By signing my name below, I, Soijett Blue, attest that this documentation has been prepared under the direction and in the presence of Tilda BurrowJohn Kiing Deakin V, MD. Electronically Signed: Soijett Blue, ED Scribe. 01/17/17. 10:52 AM.  I personally performed the services described in this documentation, which was SCRIBED in my presence. The recorded  information has been reviewed and considered accurate. It has been edited as necessary during review. Tilda BurrowFERGUSON,Deidra Spease V, MD

## 2017-01-17 NOTE — Telephone Encounter (Signed)
Preadmission screen  

## 2017-01-18 ENCOUNTER — Telehealth: Payer: Self-pay | Admitting: *Deleted

## 2017-01-18 NOTE — Telephone Encounter (Signed)
Pt states she received a voice mail from Dr. Emelda FearFerguson but she was not clear on what it was about, she states she thinks he said he had to order something or she had to come by here and pick something up before her C-sec.  Advised Dr. Emelda FearFerguson has left for the day and he is not back in the office until Wednesday.  I informed pt I would send him a message, he is on call this weekend.  Pt verbalized understanding.

## 2017-01-18 NOTE — Telephone Encounter (Signed)
Please know that you can call me for such concerns, particularly as I was [facility] way to [facility].

## 2017-01-19 LAB — GC/CHLAMYDIA PROBE AMP
Chlamydia trachomatis, NAA: NEGATIVE
Neisseria gonorrhoeae by PCR: NEGATIVE

## 2017-01-21 LAB — CULTURE, BETA STREP (GROUP B ONLY): Strep Gp B Culture: NEGATIVE

## 2017-01-22 ENCOUNTER — Encounter (HOSPITAL_COMMUNITY): Payer: Self-pay

## 2017-01-22 NOTE — Telephone Encounter (Signed)
LMOVM

## 2017-01-22 NOTE — Telephone Encounter (Signed)
Patient stated she received a call Dr Emelda FearFerguson but was unclear about his message. I informed patient she was scheduled for a Cesaran Section on 3/7 @ 12:30. Pt states she was aware of that and [facility] By reading Dr Rayna SextonFerguson's note, I was unable to determine the reason for his call. Pt has appointment on Thursday. No further questions.

## 2017-01-24 ENCOUNTER — Ambulatory Visit (INDEPENDENT_AMBULATORY_CARE_PROVIDER_SITE_OTHER): Payer: Medicaid Other | Admitting: Advanced Practice Midwife

## 2017-01-24 VITALS — BP 102/64 | HR 89 | Wt 128.0 lb

## 2017-01-24 DIAGNOSIS — Z1389 Encounter for screening for other disorder: Secondary | ICD-10-CM

## 2017-01-24 DIAGNOSIS — O09299 Supervision of pregnancy with other poor reproductive or obstetric history, unspecified trimester: Secondary | ICD-10-CM

## 2017-01-24 DIAGNOSIS — O09293 Supervision of pregnancy with other poor reproductive or obstetric history, third trimester: Secondary | ICD-10-CM

## 2017-01-24 DIAGNOSIS — Z3483 Encounter for supervision of other normal pregnancy, third trimester: Secondary | ICD-10-CM

## 2017-01-24 DIAGNOSIS — Z331 Pregnant state, incidental: Secondary | ICD-10-CM

## 2017-01-24 DIAGNOSIS — Z3A38 38 weeks gestation of pregnancy: Secondary | ICD-10-CM

## 2017-01-24 LAB — POCT URINALYSIS DIPSTICK
Blood, UA: NEGATIVE
Glucose, UA: NEGATIVE
KETONES UA: NEGATIVE
Nitrite, UA: NEGATIVE

## 2017-01-24 IMAGING — US US OB COMP LESS 14 WK
1 series · 14 of 28 positions shown · non-contrast
Comparison: None.

CLINICAL DATA: Positive home pregnancy test, pain/cramping x2 weeks

EXAM:
OBSTETRIC <14 WK US AND TRANSVAGINAL OB US
TECHNIQUE: Both transabdominal and transvaginal ultrasound examinations were
performed for complete evaluation of the gestation as well as the
maternal uterus, adnexal regions, and pelvic cul-de-sac.
Transvaginal technique was performed to assess early pregnancy.

[Series 1: us ob comp less 14 wk · 0.19mm/px · 14 of 86 slices shown]
[im 4/86]
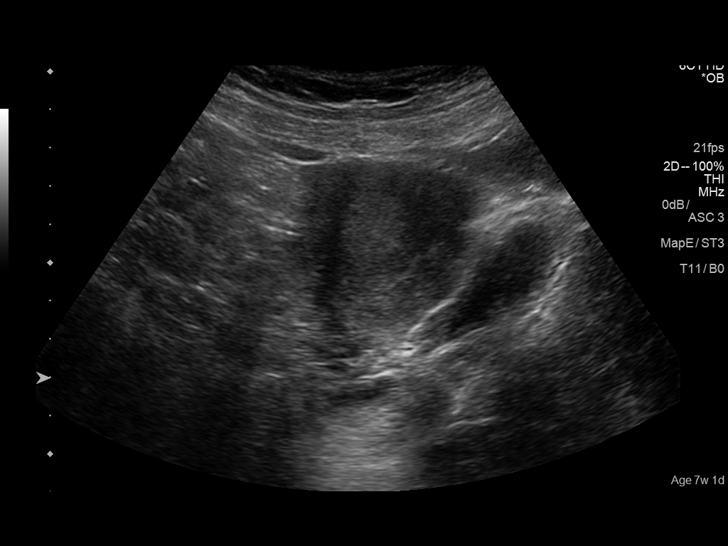
[im 10/86]
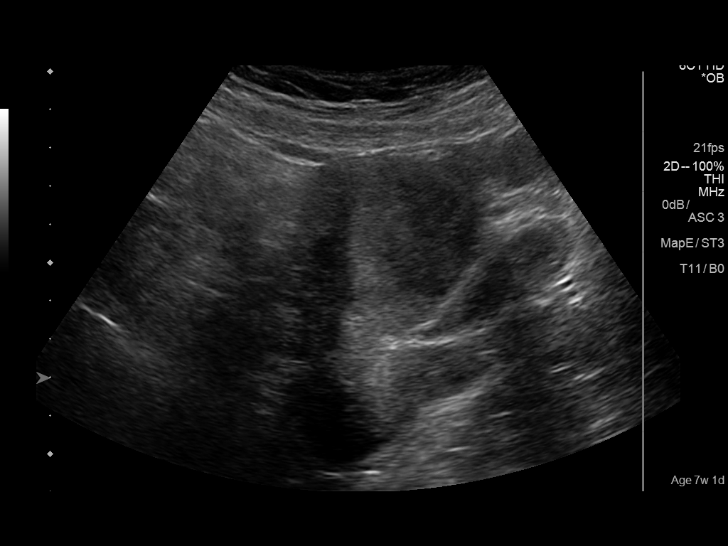
[im 16/86]
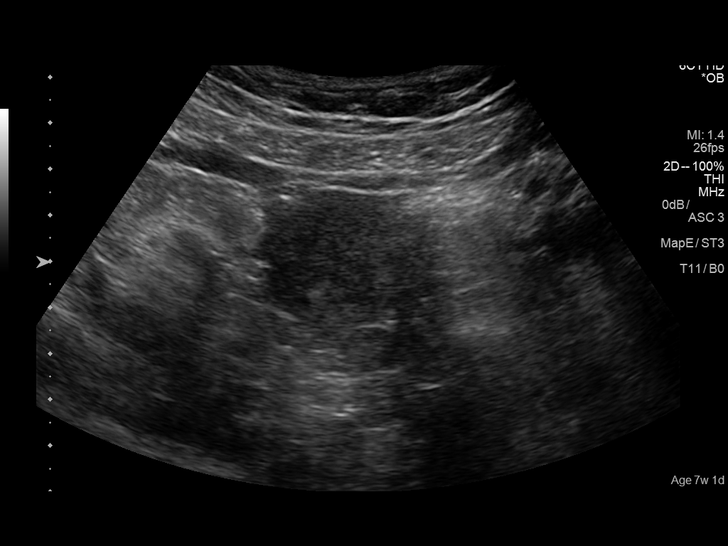
[im 23/86]
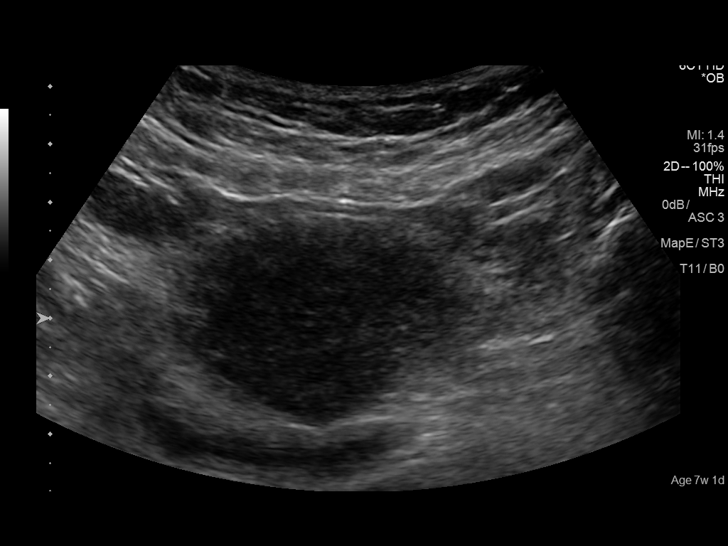
[im 29/86]
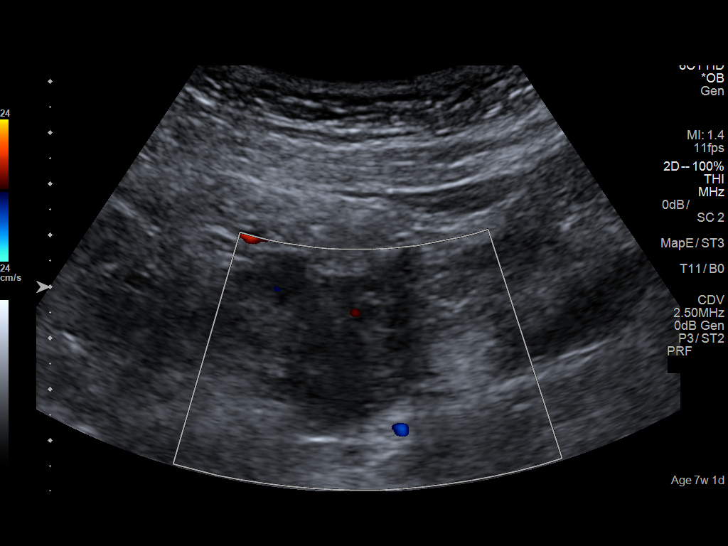
[im 35/86]
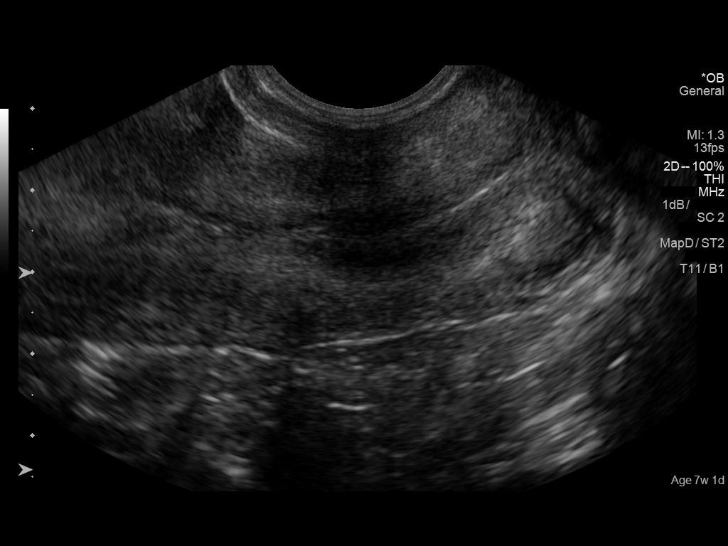
[im 41/86]
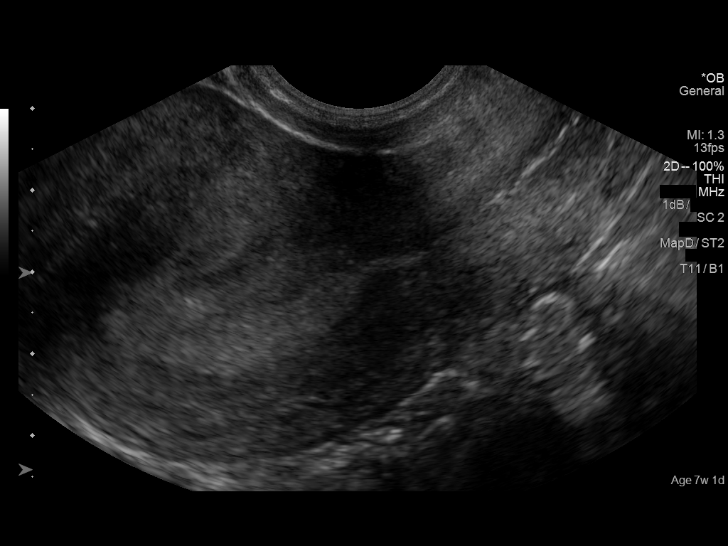
[im 48/86]
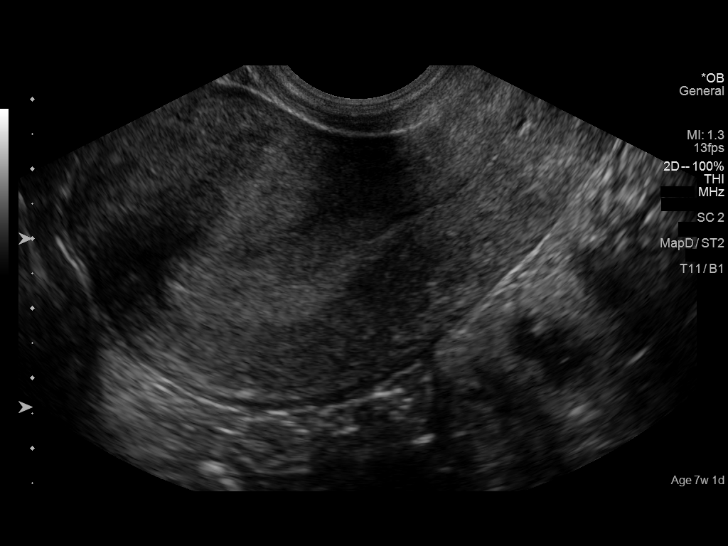
[im 54/86]
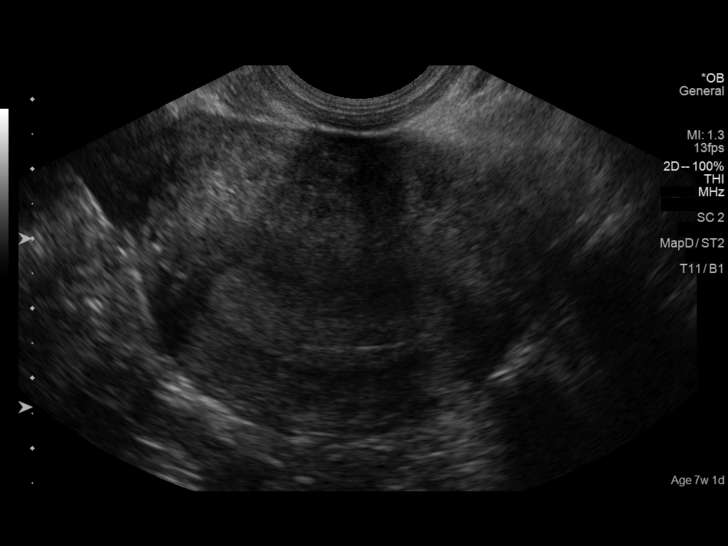
[im 60/86]
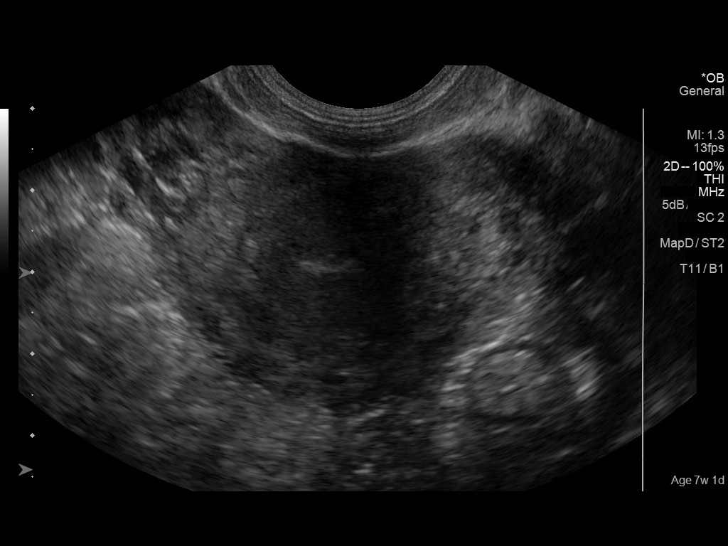
[im 67/86]
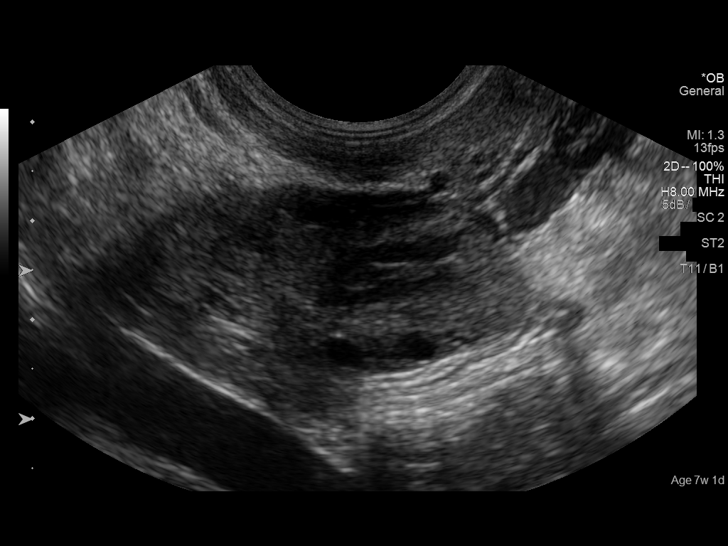
[im 73/86]
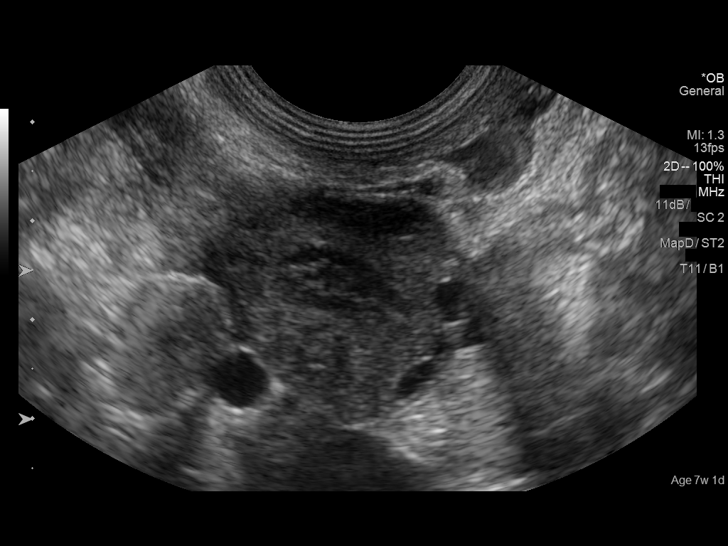
[im 79/86]
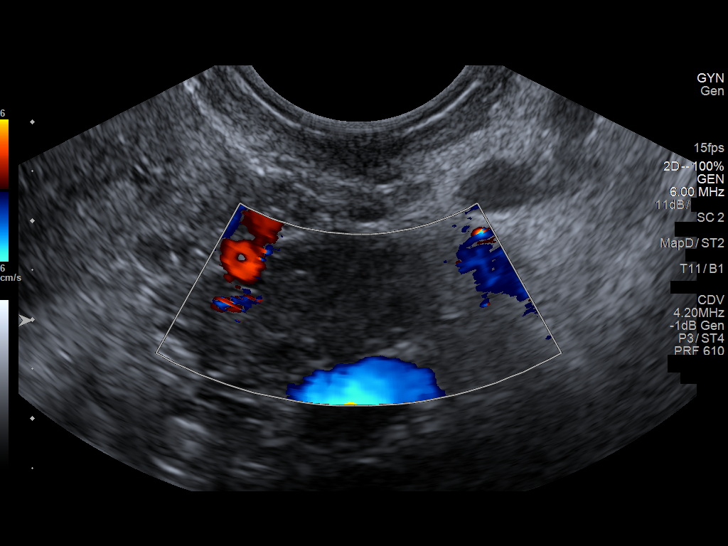
[im 86/86]
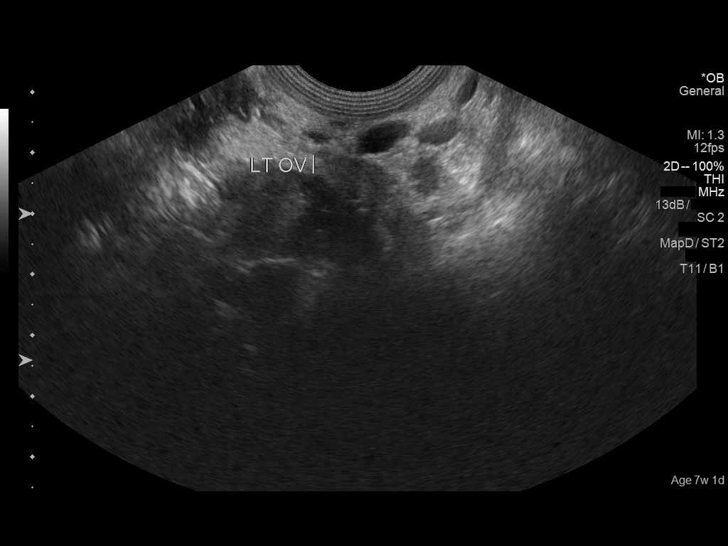

[14 of 28 positions shown; findings below may reference images not displayed]

FINDINGS: Intrauterine gestational sac: None

Subchorionic hemorrhage:  None visualized.

Maternal uterus/adnexae: Bilateral ovaries are within normal limits.

No free fluid.
IMPRESSION: No IUP is visualized.

By definition, in the setting of a positive pregnancy test, this
reflects a pregnancy of unknown location. Differential
considerations include early normal IUP, abnormal IUP/missed
abortion, or nonvisualized ectopic pregnancy.

Serial beta HCG is suggested. Consider repeat pelvic ultrasound in
14 days, as clinically warranted.

## 2017-01-24 NOTE — Progress Notes (Signed)
G2P1001 343w2d Estimated Date of Delivery: 02/05/17  Last menstrual period 04/10/2016.   BP weight and urine results all reviewed and noted.  Please refer to the obstetrical flow sheet for the fundal height and fetal heart rate documentation:  Patient reports good fetal movement, denies any bleeding and no rupture of membranes symptoms or regular contractions. Patient is without complaints. All questions were answered.  Orders Placed This Encounter  Procedures  . POCT urinalysis dipstick    Plan:  Continued routine obstetrical care, CS scheduled for 3/7 d/t hx 4th degree lac/efw 59%/elective  Return in about 3 weeks (around 02/11/2017) for incision check.

## 2017-01-28 ENCOUNTER — Other Ambulatory Visit: Payer: Self-pay | Admitting: Obstetrics and Gynecology

## 2017-01-28 NOTE — Patient Instructions (Signed)
20 Sylvia Day  01/28/2017   Your procedure is scheduled on:  01/30/2017  [facility] 1030 AM.  Pick up the phone at the desk and dial [phone].   Call this number if you have problems the morning of surgery: [phone]   Remember:   Do not eat food:After Midnight.  Do not drink clear liquids: After Midnight.  Take these medicines the morning of surgery with A SIP OF WATER: none   Do not wear jewelry, make-up or nail polish.  Do not wear lotions, powders, or perfumes. Do not wear deodorant.  Do not shave 48 hours prior to surgery.  Do not bring valuables to the hospital.  [facility] is not   responsible for any belongings or valuables brought to the hospital.  Contacts, dentures or bridgework may not be worn into surgery.  Leave suitcase in the car. After surgery it may be brought to your room.  For patients admitted to the hospital, checkout time is 11:00 AM the day of              discharge.   Patients discharged the day of surgery will not be allowed to drive             home.  Name and phone number of your driver: na  Special Instructions:   N/A   Please read over the following fact sheets that you were given:   Surgical Site Infection Prevention

## 2017-01-29 ENCOUNTER — Encounter (HOSPITAL_COMMUNITY)
Admission: RE | Admit: 2017-01-29 | Discharge: 2017-01-29 | Disposition: A | Discharge status: Home / Self Care | Payer: Medicaid Other | Source: Ambulatory Visit | Attending: Obstetrics and Gynecology | Admitting: Obstetrics and Gynecology

## 2017-01-29 LAB — CBC WITH DIFFERENTIAL/PLATELET
BASOS PCT: 0 %
Basophils Absolute: 0 10*3/uL (ref 0.0–0.1)
EOS ABS: 0.1 10*3/uL (ref 0.0–0.7)
Eosinophils Relative: 1 %
HCT: 33.4 % — ABNORMAL LOW (ref 36.0–46.0)
HEMOGLOBIN: 10.7 g/dL — AB (ref 12.0–15.0)
LYMPHS ABS: 1.4 10*3/uL (ref 0.7–4.0)
Lymphocytes Relative: 18 %
MCH: 26.6 pg (ref 26.0–34.0)
MCHC: 32 g/dL (ref 30.0–36.0)
MCV: 83.1 fL (ref 78.0–100.0)
Monocytes Absolute: 0.8 10*3/uL (ref 0.1–1.0)
Monocytes Relative: 10 %
NEUTROS PCT: 71 %
Neutro Abs: 5.4 10*3/uL (ref 1.7–7.7)
Platelets: 260 10*3/uL (ref 150–400)
RBC: 4.02 MIL/uL (ref 3.87–5.11)
RDW: 16.2 % — ABNORMAL HIGH (ref 11.5–15.5)
WBC: 7.6 10*3/uL (ref 4.0–10.5)

## 2017-01-29 LAB — TYPE AND SCREEN
ABO/RH(D): O POS
ANTIBODY SCREEN: NEGATIVE

## 2017-01-30 ENCOUNTER — Encounter (HOSPITAL_COMMUNITY): Payer: Self-pay

## 2017-01-30 ENCOUNTER — Inpatient Hospital Stay (HOSPITAL_COMMUNITY): Payer: Medicaid Other | Admitting: Anesthesiology

## 2017-01-30 ENCOUNTER — Inpatient Hospital Stay (HOSPITAL_COMMUNITY)
Admission: RE | Admit: 2017-01-30 | Discharge: 2017-02-01 | DRG: 766 | Disposition: A | Discharge status: Home / Self Care | Payer: Medicaid Other | Source: Ambulatory Visit | Attending: Obstetrics and Gynecology | Admitting: Obstetrics and Gynecology

## 2017-01-30 ENCOUNTER — Encounter (HOSPITAL_COMMUNITY)
Admission: RE | Disposition: A | Discharge status: Home / Self Care | Payer: Self-pay | Source: Ambulatory Visit | Attending: Obstetrics and Gynecology

## 2017-01-30 DIAGNOSIS — Z3490 Encounter for supervision of normal pregnancy, unspecified, unspecified trimester: Secondary | ICD-10-CM

## 2017-01-30 DIAGNOSIS — O26893 Other specified pregnancy related conditions, third trimester: Secondary | ICD-10-CM | POA: Diagnosis not present

## 2017-01-30 DIAGNOSIS — Z3493 Encounter for supervision of normal pregnancy, unspecified, third trimester: Secondary | ICD-10-CM | POA: Diagnosis present

## 2017-01-30 DIAGNOSIS — O09299 Supervision of pregnancy with other poor reproductive or obstetric history, unspecified trimester: Secondary | ICD-10-CM

## 2017-01-30 DIAGNOSIS — O3463 Maternal care for abnormality of vagina, third trimester: Secondary | ICD-10-CM

## 2017-01-30 DIAGNOSIS — Z98891 History of uterine scar from previous surgery: Secondary | ICD-10-CM

## 2017-01-30 DIAGNOSIS — Z3A39 39 weeks gestation of pregnancy: Secondary | ICD-10-CM

## 2017-01-30 LAB — RPR: RPR: NONREACTIVE

## 2017-01-30 SURGERY — Surgical Case
Anesthesia: Spinal | Site: Abdomen | Wound class: Clean Contaminated

## 2017-01-30 MED ORDER — PRENATAL MULTIVITAMIN CH
1.0000 | ORAL_TABLET | Freq: Every day | ORAL | Status: DC
  Filled 2017-01-30: qty 1

## 2017-01-30 MED ORDER — NALOXONE HCL 2 MG/2ML IJ SOSY
1.0000 ug/kg/h | PREFILLED_SYRINGE | INTRAVENOUS | Status: DC | PRN
  Filled 2017-01-30: qty 2

## 2017-01-30 MED ORDER — EPHEDRINE SULFATE 50 MG/ML IJ SOLN
INTRAMUSCULAR | Status: DC | PRN
  Administered 2017-01-30: 5 mg via INTRAVENOUS

## 2017-01-30 MED ORDER — NALBUPHINE HCL 10 MG/ML IJ SOLN: 5.0000 mg | Freq: Once | INTRAMUSCULAR | Status: DC | PRN

## 2017-01-30 MED ORDER — SCOPOLAMINE 1 MG/3DAYS TD PT72
1.0000 | MEDICATED_PATCH | Freq: Once | TRANSDERMAL | Status: DC
  Filled 2017-01-30: qty 1

## 2017-01-30 MED ORDER — ONDANSETRON HCL 4 MG/2ML IJ SOLN
INTRAMUSCULAR | Status: AC
  Filled 2017-01-30: qty 2

## 2017-01-30 MED ORDER — LACTATED RINGERS IV SOLN
INTRAVENOUS | Status: DC
  Administered 2017-01-30 – 2017-01-31 (×2): via INTRAVENOUS

## 2017-01-30 MED ORDER — PHENYLEPHRINE 40 MCG/ML (10ML) SYRINGE FOR IV PUSH (FOR BLOOD PRESSURE SUPPORT)
PREFILLED_SYRINGE | INTRAVENOUS | Status: AC
  Filled 2017-01-30: qty 10

## 2017-01-30 MED ORDER — LACTATED RINGERS IV SOLN
INTRAVENOUS | Status: DC
  Administered 2017-01-30 (×4): via INTRAVENOUS

## 2017-01-30 MED ORDER — OXYTOCIN 40 UNITS IN LACTATED RINGERS INFUSION - SIMPLE MED: 2.5000 [IU]/h | INTRAVENOUS | Status: AC

## 2017-01-30 MED ORDER — KETOROLAC TROMETHAMINE 30 MG/ML IJ SOLN
30.0000 mg | Freq: Four times a day (QID) | INTRAMUSCULAR | Status: DC | PRN
  Administered 2017-01-30: 30 mg via INTRAMUSCULAR

## 2017-01-30 MED ORDER — SODIUM CHLORIDE 0.9% FLUSH: 3.0000 mL | INTRAVENOUS | Status: DC | PRN

## 2017-01-30 MED ORDER — ONDANSETRON HCL 4 MG/2ML IJ SOLN
INTRAMUSCULAR | Status: DC | PRN
  Administered 2017-01-30: 4 mg via INTRAVENOUS

## 2017-01-30 MED ORDER — NALBUPHINE HCL 10 MG/ML IJ SOLN: 5.0000 mg | INTRAMUSCULAR | Status: DC | PRN

## 2017-01-30 MED ORDER — BUPIVACAINE IN DEXTROSE 0.75-8.25 % IT SOLN
INTRATHECAL | Status: DC | PRN
  Administered 2017-01-30: 1.4 mL via INTRATHECAL

## 2017-01-30 MED ORDER — FENTANYL CITRATE (PF) 100 MCG/2ML IJ SOLN
INTRAMUSCULAR | Status: DC | PRN
  Administered 2017-01-30: 10 ug via INTRATHECAL

## 2017-01-30 MED ORDER — DIBUCAINE 1 % RE OINT: 1.0000 "application " | TOPICAL_OINTMENT | RECTAL | Status: DC | PRN

## 2017-01-30 MED ORDER — IBUPROFEN 100 MG/5ML PO SUSP
600.0000 mg | Freq: Four times a day (QID) | ORAL | Status: DC
  Administered 2017-01-30 – 2017-02-01 (×7): 600 mg via ORAL
  Filled 2017-01-30 (×11): qty 30

## 2017-01-30 MED ORDER — KETOROLAC TROMETHAMINE 30 MG/ML IJ SOLN: 30.0000 mg | Freq: Four times a day (QID) | INTRAMUSCULAR | Status: DC | PRN

## 2017-01-30 MED ORDER — MORPHINE SULFATE (PF) 0.5 MG/ML IJ SOLN
INTRAMUSCULAR | Status: AC
  Filled 2017-01-30: qty 10

## 2017-01-30 MED ORDER — PHENYLEPHRINE 8 MG IN D5W 100 ML (0.08MG/ML) PREMIX OPTIME
INJECTION | INTRAVENOUS | Status: AC
  Filled 2017-01-30: qty 100

## 2017-01-30 MED ORDER — EPHEDRINE 5 MG/ML INJ
INTRAVENOUS | Status: AC
  Filled 2017-01-30: qty 10

## 2017-01-30 MED ORDER — COCONUT OIL OIL: 1.0000 "application " | TOPICAL_OIL | Status: DC | PRN

## 2017-01-30 MED ORDER — FENTANYL CITRATE (PF) 100 MCG/2ML IJ SOLN: 25.0000 ug | INTRAMUSCULAR | Status: DC | PRN

## 2017-01-30 MED ORDER — IBUPROFEN 600 MG PO TABS: 600.0000 mg | ORAL_TABLET | Freq: Four times a day (QID) | ORAL | Status: DC

## 2017-01-30 MED ORDER — DIPHENHYDRAMINE HCL 25 MG PO CAPS: 25.0000 mg | ORAL_CAPSULE | Freq: Four times a day (QID) | ORAL | Status: DC | PRN

## 2017-01-30 MED ORDER — DEXAMETHASONE SODIUM PHOSPHATE 4 MG/ML IJ SOLN
INTRAMUSCULAR | Status: AC
  Filled 2017-01-30: qty 1

## 2017-01-30 MED ORDER — SIMETHICONE 80 MG PO CHEW: 80.0000 mg | CHEWABLE_TABLET | ORAL | Status: DC | PRN

## 2017-01-30 MED ORDER — WITCH HAZEL-GLYCERIN EX PADS: 1.0000 "application " | MEDICATED_PAD | CUTANEOUS | Status: DC | PRN

## 2017-01-30 MED ORDER — FENTANYL CITRATE (PF) 100 MCG/2ML IJ SOLN
INTRAMUSCULAR | Status: AC
  Filled 2017-01-30: qty 2

## 2017-01-30 MED ORDER — PHENYLEPHRINE 8 MG IN D5W 100 ML (0.08MG/ML) PREMIX OPTIME
INJECTION | INTRAVENOUS | Status: DC | PRN
  Administered 2017-01-30: 40 ug/min via INTRAVENOUS

## 2017-01-30 MED ORDER — OXYTOCIN 10 UNIT/ML IJ SOLN
INTRAMUSCULAR | Status: DC | PRN
  Administered 2017-01-30: 40 [IU] via INTRAVENOUS

## 2017-01-30 MED ORDER — KETOROLAC TROMETHAMINE 30 MG/ML IJ SOLN
INTRAMUSCULAR | Status: AC
  Filled 2017-01-30: qty 1

## 2017-01-30 MED ORDER — OXYTOCIN 10 UNIT/ML IJ SOLN
INTRAMUSCULAR | Status: AC
  Filled 2017-01-30: qty 4

## 2017-01-30 MED ORDER — ACETAMINOPHEN 325 MG PO TABS
650.0000 mg | ORAL_TABLET | ORAL | Status: DC | PRN
  Administered 2017-01-30 – 2017-01-31 (×2): 650 mg via ORAL
  Filled 2017-01-30 (×3): qty 2

## 2017-01-30 MED ORDER — ONDANSETRON HCL 4 MG/2ML IJ SOLN: 4.0000 mg | Freq: Three times a day (TID) | INTRAMUSCULAR | Status: DC | PRN

## 2017-01-30 MED ORDER — ZOLPIDEM TARTRATE 5 MG PO TABS: 5.0000 mg | ORAL_TABLET | Freq: Every evening | ORAL | Status: DC | PRN

## 2017-01-30 MED ORDER — OXYCODONE HCL 5 MG PO TABS
5.0000 mg | ORAL_TABLET | ORAL | Status: DC | PRN
  Administered 2017-01-31 – 2017-02-01 (×4): 5 mg via ORAL
  Filled 2017-01-30 (×4): qty 1

## 2017-01-30 MED ORDER — NALOXONE HCL 0.4 MG/ML IJ SOLN: 0.4000 mg | INTRAMUSCULAR | Status: DC | PRN

## 2017-01-30 MED ORDER — SIMETHICONE 80 MG PO CHEW
80.0000 mg | CHEWABLE_TABLET | Freq: Three times a day (TID) | ORAL | Status: DC
  Administered 2017-01-31 – 2017-02-01 (×4): 80 mg via ORAL
  Filled 2017-01-30 (×4): qty 1

## 2017-01-30 MED ORDER — MEPERIDINE HCL 25 MG/ML IJ SOLN: 6.2500 mg | INTRAMUSCULAR | Status: DC | PRN

## 2017-01-30 MED ORDER — DEXAMETHASONE SODIUM PHOSPHATE 4 MG/ML IJ SOLN
INTRAMUSCULAR | Status: DC | PRN
  Administered 2017-01-30: 4 mg via INTRAVENOUS

## 2017-01-30 MED ORDER — SCOPOLAMINE 1 MG/3DAYS TD PT72
MEDICATED_PATCH | TRANSDERMAL | Status: DC | PRN
  Administered 2017-01-30: 1 via TRANSDERMAL

## 2017-01-30 MED ORDER — OXYCODONE HCL 5 MG PO TABS: 10.0000 mg | ORAL_TABLET | ORAL | Status: DC | PRN

## 2017-01-30 MED ORDER — SODIUM CHLORIDE 0.9 % IR SOLN
Status: DC | PRN
  Administered 2017-01-30: 1000 mL

## 2017-01-30 MED ORDER — PHENYLEPHRINE HCL 10 MG/ML IJ SOLN
INTRAMUSCULAR | Status: DC | PRN
  Administered 2017-01-30 (×6): 80 ug via INTRAVENOUS

## 2017-01-30 MED ORDER — TETANUS-DIPHTH-ACELL PERTUSSIS 5-2.5-18.5 LF-MCG/0.5 IM SUSP
0.5000 mL | Freq: Once | INTRAMUSCULAR | Status: AC
  Administered 2017-01-31: 0.5 mL via INTRAMUSCULAR
  Filled 2017-01-30: qty 0.5

## 2017-01-30 MED ORDER — DIPHENHYDRAMINE HCL 25 MG PO CAPS: 25.0000 mg | ORAL_CAPSULE | ORAL | Status: DC | PRN

## 2017-01-30 MED ORDER — SCOPOLAMINE 1 MG/3DAYS TD PT72
MEDICATED_PATCH | TRANSDERMAL | Status: AC
  Filled 2017-01-30: qty 1

## 2017-01-30 MED ORDER — MENTHOL 3 MG MT LOZG: 1.0000 | LOZENGE | OROMUCOSAL | Status: DC | PRN

## 2017-01-30 MED ORDER — DIPHENHYDRAMINE HCL 50 MG/ML IJ SOLN: 12.5000 mg | INTRAMUSCULAR | Status: DC | PRN

## 2017-01-30 MED ORDER — SENNOSIDES-DOCUSATE SODIUM 8.6-50 MG PO TABS
2.0000 | ORAL_TABLET | ORAL | Status: DC
  Administered 2017-01-31 – 2017-02-01 (×2): 2 via ORAL
  Filled 2017-01-30 (×2): qty 2

## 2017-01-30 MED ORDER — SIMETHICONE 80 MG PO CHEW
80.0000 mg | CHEWABLE_TABLET | ORAL | Status: DC
  Administered 2017-01-31 – 2017-02-01 (×2): 80 mg via ORAL
  Filled 2017-01-30 (×2): qty 1

## 2017-01-30 MED ORDER — MORPHINE SULFATE (PF) 0.5 MG/ML IJ SOLN
INTRAMUSCULAR | Status: DC | PRN
  Administered 2017-01-30: .2 mg via INTRATHECAL

## 2017-01-30 MED ORDER — CEFAZOLIN SODIUM-DEXTROSE 2-4 GM/100ML-% IV SOLN
2.0000 g | INTRAVENOUS | Status: AC
  Administered 2017-01-30: 2 g via INTRAVENOUS

## 2017-01-30 SURGICAL SUPPLY — 36 items
BENZOIN TINCTURE PRP APPL 2/3 (GAUZE/BANDAGES/DRESSINGS) IMPLANT
CHLORAPREP W/TINT 26ML (MISCELLANEOUS) ×3 IMPLANT
CLAMP CORD UMBIL (MISCELLANEOUS) ×3 IMPLANT
CLOSURE WOUND 1/2 X4 (GAUZE/BANDAGES/DRESSINGS)
CLOTH BEACON ORANGE TIMEOUT ST (SAFETY) ×3 IMPLANT
DERMABOND ADVANCED (GAUZE/BANDAGES/DRESSINGS) ×2
DERMABOND ADVANCED .7 DNX12 (GAUZE/BANDAGES/DRESSINGS) ×1 IMPLANT
DRSG OPSITE POSTOP 4X10 (GAUZE/BANDAGES/DRESSINGS) ×3 IMPLANT
ELECT REM PT RETURN 9FT ADLT (ELECTROSURGICAL) ×3
ELECTRODE REM PT RTRN 9FT ADLT (ELECTROSURGICAL) ×1 IMPLANT
GLOVE BIO SURGEON ST LM GN SZ9 (GLOVE) ×3 IMPLANT
GLOVE BIOGEL PI IND STRL 7.0 (GLOVE) ×1 IMPLANT
GLOVE BIOGEL PI IND STRL 9 (GLOVE) ×1 IMPLANT
GLOVE BIOGEL PI INDICATOR 7.0 (GLOVE) ×2
GLOVE BIOGEL PI INDICATOR 9 (GLOVE) ×2
GOWN STRL REUS W/TWL 2XL LVL3 (GOWN DISPOSABLE) ×3 IMPLANT
GOWN STRL REUS W/TWL LRG LVL3 (GOWN DISPOSABLE) ×3 IMPLANT
NEEDLE HYPO 25X5/8 SAFETYGLIDE (NEEDLE) ×3 IMPLANT
NS IRRIG 1000ML POUR BTL (IV SOLUTION) ×3 IMPLANT
PACK C SECTION WH (CUSTOM PROCEDURE TRAY) ×3 IMPLANT
PAD ABD 8X7 1/2 STERILE (GAUZE/BANDAGES/DRESSINGS) ×3 IMPLANT
PAD OB MATERNITY 4.3X12.25 (PERSONAL CARE ITEMS) ×3 IMPLANT
PENCIL SMOKE EVAC W/HOLSTER (ELECTROSURGICAL) ×3 IMPLANT
SPONGE GAUZE 4X4 12PLY STER LF (GAUZE/BANDAGES/DRESSINGS) ×6 IMPLANT
SPONGE LAP 18X18 X RAY DECT (DISPOSABLE) ×3 IMPLANT
STRIP CLOSURE SKIN 1/2X4 (GAUZE/BANDAGES/DRESSINGS) IMPLANT
SUT MNCRL 0 VIOLET CTX 36 (SUTURE) ×3 IMPLANT
SUT MONOCRYL 0 CTX 36 (SUTURE) ×6
SUT VIC AB 0 CT1 27 (SUTURE) ×2
SUT VIC AB 0 CT1 27XBRD ANBCTR (SUTURE) ×1 IMPLANT
SUT VIC AB 2-0 CT1 27 (SUTURE) ×2
SUT VIC AB 2-0 CT1 TAPERPNT 27 (SUTURE) ×1 IMPLANT
SUT VIC AB 4-0 KS 27 (SUTURE) ×3 IMPLANT
TAPE CLOTH SURG 4X10 WHT LF (GAUZE/BANDAGES/DRESSINGS) ×3 IMPLANT
TOWEL OR 17X24 6PK STRL BLUE (TOWEL DISPOSABLE) ×3 IMPLANT
TRAY FOLEY BAG SILVER LF 16FR (SET/KITS/TRAYS/PACK) ×3 IMPLANT

## 2017-01-30 NOTE — Addendum Note (Signed)
Addendum  created 01/30/17 2040 by Graciela HusbandsWynn O Laquanna Veazey, CRNA   Sign clinical note

## 2017-01-30 NOTE — Interval H&P Note (Signed)
History and Physical Interval Note:  01/30/2017 12:52 PM  Sylvia Day  has presented today for surgery, with the diagnosis of Primary C-Section  The various methods of treatment have been discussed with the patient and family. After consideration of risks, benefits and other options for treatment, the patient has consented to  Procedure(s): PRIMARY CESAREAN SECTION (N/A) as a surgical intervention .  The patient's history has been reviewed, patient examined, no change in status, stable for surgery.  I have reviewed the patient's chart and labs.  Questions were answered to the patient's satisfaction.     Additionally Dr Emelda FearFerguson is ill and I am performing the c section in his stead.  I have talked with the patient and again due to her previous 4th degree with a 6lb6oz baby she is electing to have a primary Caesarean section>  She and Dr Emelda FearFerguson discussed this issue at length and agree with their decision  Lazaro ArmsEURE,Asani Deniston H, MD 01/30/2017 12:54 PM   Tashe Purdon H

## 2017-01-30 NOTE — H&P (Signed)
Sylvia Day is a 31 y.o. female G2P1001 with a history of a 4th degree laceration with her first pregnancy, who has requested an elective primary cesarean section for this delivery. Her first pregnancy ended in vaginal delivery in 2009 at [facility] of a 6lb 13 oz infant, complicated by 4th degree laceration, as well as postpartum hemmorhage with HGB drop to 4.1 at a time the pt was Jehovah's witness, and refused blood products.  She states at this time she would accept blood products, having changed religious affiliations, but has no desire to risk a repeat laceration.  This baby's EFW was 6lb 8 oz at 36 wk by u/s, and appears at least that large, given pt's petite body habitus. Pelvic exam shows a good surgical repair of her prior 4th degree, and pt denies any rectocele symptoms at present, even though there is some rectocele present above an apparently intact anal sphincter. OB History    Gravida Para Term Preterm AB Living   2 1 1     1    SAB TAB Ectopic Multiple Live Births           1     Past Medical History:  Diagnosis Date  . Hives   . Medical history non-contributory   . Pregnant 06/04/2016   Past Surgical History:  Procedure Laterality Date  . NO PAST SURGERIES     Family History: family history includes Cancer in her paternal grandmother; Other in her paternal grandfather; Stroke in her father and maternal grandmother. Social History:  reports that she has never smoked. She has never used smokeless tobacco. She reports that she does not drink alcohol or use drugs.     Maternal Diabetes: No Genetic Screening: Normal Maternal Ultrasounds/Referrals: Normal Fetal Ultrasounds or other Referrals:  None Maternal Substance Abuse:  No Significant Maternal Medications:  None Significant Maternal Lab Results:  Lab values include: Group B Strep negative Other Comments:  None  Review of Systems  Constitutional: Negative.   Eyes: Negative.   Respiratory: Negative.   Cardiovascular:  Negative.   Gastrointestinal: Negative for blood in stool.  Genitourinary: Negative.   Skin: Negative.   Endo/Heme/Allergies: Negative.    History   Last menstrual period 04/10/2016. Exam Physical Exam  Constitutional: She is oriented to person, place, and time. She appears well-developed and well-nourished.  HENT:  Head: Normocephalic and atraumatic.  Cardiovascular: Normal rate.   Respiratory: Effort normal.  GI: Soft.  Gravid  Uterus consistent with dates.  Genitourinary: Vagina normal.  Genitourinary Comments: Intact anal sphincter , good anal wink, small rectocele that pt describes as currently assumptomatic.  Neurological: She is alert and oriented to person, place, and time. She has normal reflexes.  Skin: Skin is warm and dry.  Psychiatric: She has a normal mood and affect. Her behavior is normal. Judgment and thought content normal.    Prenatal labs: CBC Latest Ref Rng & Units 01/29/2017 11/06/2016 06/25/2016  WBC 4.0 - 10.5 K/uL 7.6 8.8 8.1  Hemoglobin 12.0 - 15.0 g/dL 10.7(L) - -  Hematocrit 36.0 - 46.0 % 33.4(L) 33.3(L) 39.6  Platelets 150 - 400 K/uL 260 316 402(H)    ABO, Rh: --/--/O POS (03/06 1210) Antibody: NEG (03/06 1210) Rubella: 3.95 (07/31 1115) RPR: Non Reactive (03/06 1210)  HBsAg: Negative (07/31 1115)  HIV: Non Reactive (12/12 0925)  GBS:   neg  Assessment/Plan: Pregnancy 39 wks, requested elective primary cesarean due to  Prior 4th degree laceration, prior PPH, at delivery 08/2008 Hx Jehovah's witness,  currently willing to accept blood if medically indicated. Plan  Primary cesarean section at 1:30 today, Dr Despina HiddenEure to perform in my absence due to my current illness. Rationale, risks benefits, alternatives extensively discussed at recent prenatal visits.    Ariadna Setter V 01/30/2017, 10:40 AM

## 2017-01-30 NOTE — Anesthesia Preprocedure Evaluation (Addendum)
Anesthesia Evaluation  Patient identified by MRN, date of birth, ID band Patient awake    Reviewed: Allergy & Precautions, NPO status , Patient's Chart, lab work & pertinent test results  Airway Mallampati: II  TM Distance: >3 FB     Dental   Pulmonary neg pulmonary ROS,    breath sounds clear to auscultation       Cardiovascular negative cardio ROS   Rhythm:Regular Rate:Normal     Neuro/Psych negative neurological ROS     GI/Hepatic negative GI ROS, Neg liver ROS,   Endo/Other  negative endocrine ROS  Renal/GU negative Renal ROS     Musculoskeletal   Abdominal   Peds  Hematology  (+) anemia ,   Anesthesia Other Findings   Reproductive/Obstetrics (+) Pregnancy                            Lab Results  Component Value Date   WBC 7.6 01/29/2017   HGB 10.7 (L) 01/29/2017   HCT 33.4 (L) 01/29/2017   MCV 83.1 01/29/2017   PLT 260 01/29/2017    Anesthesia Physical Anesthesia Plan  ASA: II  Anesthesia Plan: Spinal   Post-op Pain Management:    Induction:   Airway Management Planned: Natural Airway  Additional Equipment:   Intra-op Plan:   Post-operative Plan:   Informed Consent: I have reviewed the patients History and Physical, chart, labs and discussed the procedure including the risks, benefits and alternatives for the proposed anesthesia with the patient or authorized representative who has indicated his/her understanding and acceptance.     Plan Discussed with:   Anesthesia Plan Comments:         Anesthesia Quick Evaluation

## 2017-01-30 NOTE — Anesthesia Procedure Notes (Signed)
Spinal  Start time: 01/30/2017 1:03 PM End time: 01/30/2017 1:08 PM Staffing Anesthesiologist: Marcene DuosFITZGERALD, Quintavia Rogstad Performed: anesthesiologist  Preanesthetic Checklist Completed: patient identified, site marked, surgical consent, pre-op evaluation, timeout performed, IV checked, risks and benefits discussed and monitors and equipment checked Spinal Block Patient position: sitting Prep: site prepped and draped and DuraPrep Patient monitoring: continuous pulse ox, blood pressure and heart rate Approach: midline Location: L4-5 Injection technique: single-shot Needle Needle type: Pencan  Needle gauge: 24 G Needle length: 9 cm Needle insertion depth: 6 cm Assessment Sensory level: T6

## 2017-01-30 NOTE — Transfer of Care (Signed)
Immediate Anesthesia Transfer of Care Note  Patient: Sylvia Day  Procedure(s) Performed: Procedure(s): PRIMARY CESAREAN SECTION (N/A)  Patient Location: PACU  Anesthesia Type:Spinal  Level of Consciousness: awake, alert  and oriented  Airway & Oxygen Therapy: Patient Spontanous Breathing  Post-op Assessment: Report given to RN and Post -op Vital signs reviewed and stable  Post vital signs: Reviewed and stable  Last Vitals:  Vitals:   01/30/17 1058  BP: 118/76  Pulse: 79  Resp: 16  Temp: 36.6 C    Last Pain:  Vitals:   01/30/17 1058  TempSrc: Oral         Complications: No apparent anesthesia complications

## 2017-01-30 NOTE — Lactation Note (Signed)
This note was copied from a baby's chart. Lactation Consultation Note  Patient Name: Sylvia Day Today's Date: 01/30/2017 Reason for consult: Initial assessment   Initial consult with mom of 1 hour old infant in PACU. Mom reports she had a PPH and Hgb of 4 with first child and stayed in hospital for 2 weeks. She reports infant was a difficult latch and was on formula before she went home. Mom reports she plans to breast and formula feed since she will return to work in April.   Infant awake and cueing. Mom with firm breasts and compressible areola with everted nipples. Glistening of colostrum noted with hand expression from both breasts. Attempted to latch infant to right breast in the laid back cross cradle hold. Infant smacking and not able to maintain latch due to positioning. Then changed infant to left breast in the laid back position, infant latched immediately. Mom reports some pain with latch that improved with gentle chin tug. Discussed importance of relatching if needed if pain does not improve with lip flanging. Discussed BF basics, positioning, and pillow and head support. Enc mom to feed infant STS 8-12 x in 24 hours at first feeding cues. Enc mom to massage/compress breast with feedings to maximize milk transfer. Infant latched and actively feeding for 10 minutes and still feeding when I left the room. Feeding log given with instructions for use.   Colostrum, infant stomach size, milk coming to volume, cluster feeding, hand expression, BF basics and postioning reviewed with mom. Discussed the importance of supply and demand to establishment of milk supply, especially in the first 2 weeks. LEAD and risks of formula discussed with mom and enc mom to always BF first before giving formula. Mom has a pump at home, she is unsure of the brand.   BF Resources Handout and LC Brochure given, mom informed of IP/OP Services, BF Support Groups and LC phone #. Enc mom to call out to desk for  feeding assistance as needed.        Maternal Data Formula Feeding for Exclusion: Yes Reason for exclusion: Mother's choice to formula and breast feed on admission Has patient been taught Hand Expression?: Yes Does the patient have breastfeeding experience prior to this delivery?: Yes  Feeding Feeding Type: Breast Fed Length of feed: 10 min (still feeding when I left the room)  LATCH Score/Interventions Latch: Grasps breast easily, tongue down, lips flanged, rhythmical sucking.  Audible Swallowing: A few with stimulation Intervention(s): Skin to skin;Hand expression;Alternate breast massage  Type of Nipple: Everted at rest and after stimulation  Comfort (Breast/Nipple): Soft / non-tender     Hold (Positioning): Assistance needed to correctly position infant at breast and maintain latch. Intervention(s): Breastfeeding basics reviewed;Support Pillows;Position options;Skin to skin  LATCH Score: 8  Lactation Tools Discussed/Used     Consult Status Consult Status: Follow-up Date: 01/31/17 Follow-up type: In-patient    Sylvia Day 01/30/2017, 2:59 PM

## 2017-01-30 NOTE — Anesthesia Postprocedure Evaluation (Signed)
Anesthesia Post Note  Patient: [name]  Procedure(s) Performed: Procedure(s) (LRB): PRIMARY CESAREAN SECTION (N/A)  Patient location during evaluation: PACU Anesthesia Type: Spinal Level of consciousness: awake and alert Pain management: pain level controlled Vital Signs Assessment: post-procedure vital signs reviewed and stable Respiratory status: spontaneous breathing and respiratory function stable Cardiovascular status: blood pressure returned to baseline and stable Postop Assessment: spinal receding Anesthetic complications: no        Last Vitals:  Vitals:   01/30/17 1530 01/30/17 1604  BP: 96/76 106/68  Pulse: 60 61  Resp: 18 16  Temp: 36.7 C 36.3 C    Last Pain:  Vitals:   01/30/17 1612  TempSrc:   PainSc: 3    Pain Goal: Patients Stated Pain Goal: 1 (01/30/17 1612)               Sylvia Day, Sylvia Day

## 2017-01-30 NOTE — Op Note (Signed)
Preoperative diagnosis:  1.  Intrauterine pregnancy at 7439 and [redacted] weeks gestation                                         2.  Previous 4th degree tear and PPH                                      Postoperative diagnosis:  Same as above  Procedure:  Primary cesarean section  Surgeon:  Lazaro ArmsLuther H Eure MD                  Ernestina PennaNicholas Schenk MD  Assistant:  none  Anesthesia: Spinal  Findings:     Over a low transverse incision was delivered a viable female with Apgars of 9 and 9 weighing 7 lbs. 1 oz. Uterus, tubes and ovaries were all normal.  There were no other significant findings  Description of operation:  Patient was taken to the operating room and placed in the sitting position where she underwent a spinal anesthetic. She was then placed in the supine position with tilt to the left side. When adequate anesthetic level was obtained she was prepped and draped in usual sterile fashion and a Foley catheter was placed. A Pfannenstiel skin incision was made and carried down sharply to the rectus fascia which was scored in the midline extended laterally. The fascia was taken off the muscles both superiorly and without difficulty. The muscles were divided.  The peritoneal cavity was entered.  Bladder blade was placed, no bladder flap was created.  A low transverse hysterotomy incision was made and delivered a viable female  infant at [id number] with Apgars of 9 and 9 weighing7 lbs 1 oz.  Cord pH was obtained and was pending. The uterus was exteriorized. It was closed in 2 layers, the first being a running interlocking layer and the second being an imbricating layer using 0 monocryl on a CTX needle. There was good resulting hemostasis. The uterus tubes and ovaries were all normal. Peritoneal cavity was irrigated vigorously. The muscles and peritoneum were reapproximated loosely. The fascia was closed using 0 Vicryl in running fashion. Subcutaneous tissue was made hemostatic and irrigated. The skin was closed using  4-0 Vicryl on a Keith needle in a subcuticular fashion.  Dermabond was placed for additional wound integrity and to serve as a barrier. Blood loss for the procedure was 600 cc. The patient received a gram of Ancef prophylactically. The patient was taken to the recovery room in good stable condition with all counts being correct x3.  EBL 600 cc  Ernestina Pennaicholas Schenk 01/30/2017 3:33 PM

## 2017-01-30 NOTE — Anesthesia Postprocedure Evaluation (Signed)
Anesthesia Post Note  Patient: [name]  Procedure(s) Performed: Procedure(s) (LRB): PRIMARY CESAREAN SECTION (N/A)  Patient location during evaluation: Mother Baby Anesthesia Type: Spinal Level of consciousness: awake and alert and oriented Pain management: satisfactory to patient Vital Signs Assessment: post-procedure vital signs reviewed and stable Respiratory status: respiratory function stable and spontaneous breathing Cardiovascular status: blood pressure returned to baseline Postop Assessment: no headache, no backache, spinal receding, patient able to bend at knees and adequate PO intake Anesthetic complications: no        Last Vitals:  Vitals:   01/30/17 1827 01/30/17 1941  BP: 111/71 127/70  Pulse: (!) 56 (!) 59  Resp: 16 16  Temp: 36.3 C 36.6 C    Last Pain:  Vitals:   01/30/17 1941  TempSrc: Oral  PainSc: 3    Pain Goal: Patients Stated Pain Goal: 2 (01/30/17 1941)               Karleen DolphinFUSSELL,[name]

## 2017-01-31 LAB — CBC
HEMATOCRIT: 26.1 % — AB (ref 36.0–46.0)
HEMOGLOBIN: 8.6 g/dL — AB (ref 12.0–15.0)
MCH: 27.1 pg (ref 26.0–34.0)
MCHC: 33 g/dL (ref 30.0–36.0)
MCV: 82.3 fL (ref 78.0–100.0)
Platelets: 217 10*3/uL (ref 150–400)
RBC: 3.17 MIL/uL — AB (ref 3.87–5.11)
RDW: 15.9 % — ABNORMAL HIGH (ref 11.5–15.5)
WBC: 12.5 10*3/uL — AB (ref 4.0–10.5)

## 2017-01-31 LAB — BIRTH TISSUE RECOVERY COLLECTION (PLACENTA DONATION)

## 2017-01-31 MED ORDER — COMPLETENATE 29-1 MG PO CHEW
1.0000 | CHEWABLE_TABLET | Freq: Every day | ORAL | Status: DC
  Administered 2017-01-31: 1 via ORAL
  Filled 2017-01-31 (×3): qty 1

## 2017-01-31 MED ORDER — FERROUS FUMARATE 324 (106 FE) MG PO TABS
1.0000 | ORAL_TABLET | Freq: Two times a day (BID) | ORAL | Status: DC
  Administered 2017-01-31 – 2017-02-01 (×3): 106 mg via ORAL
  Filled 2017-01-31 (×5): qty 1

## 2017-01-31 NOTE — Plan of Care (Signed)
Problem: Nutritional: Goal: Mothers verbalization of comfort with breastfeeding process will improve Outcome: Not Applicable Date Met: 01/31/17 Mother has decided to switch to bottlefeeding   

## 2017-01-31 NOTE — Progress Notes (Signed)
Subjective: Postpartum Day #1: Cesarean Delivery Patient reports + flatus and no problems voiding. Breastfeeding going well; Nexplanon for PP contraception   Objective: Vital signs in last 24 hours: Temp:  [97.2 F (36.2 C)-98.7 F (37.1 C)] 98.7 F (37.1 C) (03/08 0600) Pulse Rate:  [52-87] 66 (03/08 0600) Resp:  [16-22] 16 (03/08 0600) BP: (96-127)/(52-76) 112/66 (03/08 0600) SpO2:  [98 %-100 %] 98 % (03/07 2208) Weight:  [57.6 kg (127 lb)] 57.6 kg (127 lb) (03/07 1058)  Physical Exam:  General: alert, cooperative and no distress Lochia: appropriate Uterine Fundus: firm Incision: dsg intact DVT Evaluation: No evidence of DVT seen on physical exam.   Recent Labs  01/29/17 1210 01/31/17 0546  HGB 10.7* 8.6*  HCT 33.4* 26.1*    Assessment/Plan: Status post Cesarean section. Doing well postoperatively.  Continue current care. Anticipate d/c on 02/01/17  Cam HaiSHAW, Orella Cushman CNM 01/31/2017, 9:05 AM

## 2017-02-01 MED ORDER — OXYCODONE-ACETAMINOPHEN 5-325 MG PO TABS: 1.0000 | ORAL_TABLET | ORAL | 0 refills | Status: DC | PRN

## 2017-02-01 MED ORDER — FERROUS FUMARATE 324 (106 FE) MG PO TABS: 1.0000 | ORAL_TABLET | Freq: Every day | ORAL | 3 refills | Status: DC

## 2017-02-01 MED ORDER — SENNOSIDES-DOCUSATE SODIUM 8.6-50 MG PO TABS: 2.0000 | ORAL_TABLET | ORAL | 0 refills | Status: DC

## 2017-02-01 MED ORDER — IBUPROFEN 600 MG PO TABS: 600.0000 mg | ORAL_TABLET | Freq: Four times a day (QID) | ORAL | 0 refills | Status: DC | PRN

## 2017-02-01 NOTE — Discharge Instructions (Signed)

## 2017-02-01 NOTE — Discharge Summary (Signed)
OB Discharge Summary     Patient Name: Sylvia PesaMilagros Day DOB: 17-Mar-1986 MRN: [id number]  Date of admission: 01/30/2017 Delivering MD: Duane LopeEURE, LUTHER H   Date of discharge: 02/01/2017  Admitting diagnosis: Primary C-Section Intrauterine pregnancy: 9770w1d     Secondary diagnosis:  Active Problems:   Supervision of normal pregnancy   H/O maternal fourth degree perineal laceration, currently pregnant   History of postpartum hemorrhage, currently pregnant   S/P primary low transverse C-section  Additional problems: None     Discharge diagnosis: Term Pregnancy Delivered                                                                                                Post partum procedures:None  Augmentation: None  Complications: None  Hospital course:  Sceduled C/S   31 y.o. yo G2P2002 at 5470w1d was admitted to the hospital 01/30/2017 for scheduled cesarean section with the following indication:Elective Primary and for history of 4th degree laceration..  Membrane Rupture Time/Date: 1:27 PM ,01/30/2017   Patient delivered a Viable infant.01/30/2017  Details of operation can be found in separate operative note.  Pateint had an uncomplicated postpartum course.  She is ambulating, tolerating a regular diet, passing flatus, and urinating well. Patient is discharged home in stable condition on  02/01/17         Physical exam  Vitals:   01/31/17 0600 01/31/17 1000 01/31/17 1821 02/01/17 0614  BP: 112/66 (!) 104/52 (!) 100/49 98/61  Pulse: 66 64 63 69  Resp: 16 18 17 18   Temp: 98.7 F (37.1 C) 98.6 F (37 C) 98.3 F (36.8 C) 98.4 F (36.9 C)  TempSrc: Oral Oral Oral   SpO2:  99%    Weight:      Height:       General: alert, cooperative and no distress Lochia: appropriate Uterine Fundus: firm Incision: Healing well with no significant drainage, No significant erythema, Dressing is clean, dry, and intact DVT Evaluation: No evidence of DVT seen on physical exam. Negative Homan's sign. No  cords or calf tenderness. Labs: Lab Results  Component Value Date   WBC 12.5 (H) 01/31/2017   HGB 8.6 (L) 01/31/2017   HCT 26.1 (L) 01/31/2017   MCV 82.3 01/31/2017   PLT 217 01/31/2017   CMP Latest Ref Rng & Units 01/11/2017  Glucose 65 - 99 mg/dL 69  BUN 6 - 20 mg/dL 4(L)  Creatinine 4.090.57 - 1.00 mg/dL 8.11(B0.42(L)  Sodium 147134 - 829144 mmol/L 139  Potassium 3.5 - 5.2 mmol/L 4.5  Chloride 96 - 106 mmol/L 103  CO2 18 - 29 mmol/L 17(L)  Calcium 8.7 - 10.2 mg/dL 8.7  Total Protein 6.0 - 8.5 g/dL 6.3  Total Bilirubin 0.0 - 1.2 mg/dL 0.2  Alkaline Phos 39 - 117 IU/L 128(H)  AST 0 - 40 IU/L 20  ALT 0 - 32 IU/L 14    Discharge instruction: per After Visit Summary and "Baby and Me Booklet".  After visit meds:  Allergies as of 02/01/2017      Reactions   Latex Other (See Comments)   irritation   Percocet [  oxycodone-acetaminophen] Nausea And Vomiting   headache      Medication List    STOP taking these medications   aspirin EC 81 MG tablet   Doxylamine-Pyridoxine 10-10 MG Tbec Commonly known as:  DICLEGIS   metroNIDAZOLE 0.75 % vaginal gel Commonly known as:  METROGEL VAGINAL   nystatin ointment Commonly known as:  MYCOSTATIN   terconazole 0.4 % vaginal cream Commonly known as:  TERAZOL 7   triamcinolone ointment 0.1 % Commonly known as:  KENALOG     TAKE these medications   cetirizine 10 MG tablet Commonly known as:  ZYRTEC Take one daily for runny nose or drainage. What changed:  how much to take  how to take this  when to take this  reasons to take this  additional instructions   EPINEPHrine 0.3 mg/0.3 mL Soaj injection Commonly known as:  EPI-PEN Inject 0.3 mg into the muscle as needed (allergic reaction).   Ferrous Fumarate 324 (106 Fe) MG Tabs tablet Commonly known as:  HEMOCYTE - 106 mg FE Take 1 tablet (106 mg of iron total) by mouth daily.   fluocinonide-emollient 0.05 % cream Commonly known as:  LIDEX-E Apply 1 application topically 2 (two)  times daily.   ibuprofen 600 MG tablet Commonly known as:  ADVIL,MOTRIN Take 1 tablet (600 mg total) by mouth every 6 (six) hours as needed.   oxyCODONE-acetaminophen 5-325 MG tablet Commonly known as:  ROXICET Take 1-2 tablets by mouth every 4 (four) hours as needed for severe pain.   PRENATAL GUMMIES/DHA & FA PO Take 1 tablet by mouth daily. Takes 2 daily   senna-docusate 8.6-50 MG tablet Commonly known as:  Senokot-S Take 2 tablets by mouth daily. Start taking on:  02/02/2017       Diet: routine diet  Activity: Advance as tolerated. Pelvic rest for 6 weeks.   Outpatient follow up:6 weeks Follow up Appt:Future Appointments Date Time Provider Department Center  02/14/2017 10:30 AM Lazaro Arms, MD [facility]   Follow up Visit:No Follow-up on file.  Postpartum contraception: Nexplanon  Newborn Data: Live born female  Birth Weight: 7 lb 1.4 oz (3215 g) APGAR: 9, 9  Baby Feeding: Bottle and Breast Disposition:home with mother   02/01/2017 Jen Mow, DO  OB Fellow

## 2017-02-14 ENCOUNTER — Encounter: Payer: Self-pay | Admitting: Obstetrics & Gynecology

## 2017-02-14 ENCOUNTER — Ambulatory Visit (INDEPENDENT_AMBULATORY_CARE_PROVIDER_SITE_OTHER): Payer: Medicaid Other | Admitting: Obstetrics & Gynecology

## 2017-02-14 VITALS — BP 116/74 | HR 68 | Ht 59.0 in | Wt 110.0 lb

## 2017-02-14 DIAGNOSIS — Z98891 History of uterine scar from previous surgery: Secondary | ICD-10-CM

## 2017-02-14 NOTE — Progress Notes (Signed)
  HPI: Patient returns for routine postoperative follow-up having undergone primary Caesarean section on 01/30/2017.  The patient's immediate postoperative recovery has been unremarkable. Since hospital discharge the patient reports no problems.   Current Outpatient Prescriptions: ibuprofen (ADVIL,MOTRIN) 600 MG tablet, Take 1 tablet (600 mg total) by mouth every 6 (six) hours as needed., Disp: 30 tablet, Rfl: 0 Pediatric Multiple Vit-C-FA (FLINSTONES GUMMIES OMEGA-3 DHA PO), Take by mouth., Disp: , Rfl:  cetirizine (ZYRTEC) 10 MG tablet, Take one daily for runny nose or drainage. (Patient not taking: Reported on 02/14/2017), Disp: 30 tablet, Rfl: 0 EPINEPHrine 0.3 mg/0.3 mL IJ SOAJ injection, Inject 0.3 mg into the muscle as needed (allergic reaction). , Disp: , Rfl:  Ferrous Fumarate (HEMOCYTE - 106 MG FE) 324 (106 Fe) MG TABS tablet, Take 1 tablet (106 mg of iron total) by mouth daily. (Patient not taking: Reported on 02/14/2017), Disp: 30 tablet, Rfl: 3 fluocinonide-emollient (LIDEX-E) 0.05 % cream, Apply 1 application topically 2 (two) times daily. (Patient not taking: Reported on 02/14/2017), Disp: 30 g, Rfl: 0 oxyCODONE-acetaminophen (ROXICET) 5-325 MG tablet, Take 1-2 tablets by mouth every 4 (four) hours as needed for severe pain. (Patient not taking: Reported on 02/14/2017), Disp: 30 tablet, Rfl: 0 Prenatal MV-Min-FA-Omega-3 (PRENATAL GUMMIES/DHA & FA PO), Take 1 tablet by mouth daily. Takes 2 daily, Disp: , Rfl:  senna-docusate (SENOKOT-S) 8.6-50 MG tablet, Take 2 tablets by mouth daily. (Patient not taking: Reported on 02/14/2017), Disp: 60 tablet, Rfl: 0  No current facility-administered medications for this visit.     Blood pressure 116/74, pulse 68, height 4\' 11"  (1.499 m), weight 110 lb (49.9 kg), not currently breastfeeding.  Physical Exam: Incision clean dry intact, glue still intact  Diagnostic Tests:   Pathology:   Impression: Normal post op visit  Plan:   Follow  up: 4  weeks for pp exam  Lazaro ArmsEURE,LUTHER H, MD

## 2017-02-28 ENCOUNTER — Other Ambulatory Visit: Payer: Self-pay | Admitting: Advanced Practice Midwife

## 2017-02-28 ENCOUNTER — Telehealth: Payer: Self-pay | Admitting: *Deleted

## 2017-02-28 MED ORDER — FLUCONAZOLE 150 MG PO TABS: ORAL_TABLET | ORAL | 2 refills | Status: DC

## 2017-02-28 NOTE — Telephone Encounter (Signed)
Sent to pharmacy 

## 2017-02-28 NOTE — Telephone Encounter (Signed)
Patient called with complaints of a yeast infection, itching, and white chunks. Patient states she took "a test" from [place] that indicated yeast so she started taking Monistat but is now having burning. She would like Diflucan if possible. Please advise.

## 2017-02-28 NOTE — Progress Notes (Signed)
Diflucan for yeast

## 2017-03-14 ENCOUNTER — Encounter: Payer: Self-pay | Admitting: Women's Health

## 2017-03-14 ENCOUNTER — Ambulatory Visit (INDEPENDENT_AMBULATORY_CARE_PROVIDER_SITE_OTHER): Payer: Medicaid Other | Admitting: Women's Health

## 2017-03-14 NOTE — Progress Notes (Signed)
Subjective:    Sylvia Day is a 31 y.o. G51P2002 Hispanic female who presents for a postpartum visit. She is 6 weeks postpartum following an elective primary cesarean section, low transverse incision at 39 gestational weeks d/t h/o 4th degree lac & PPH. Anesthesia: spinal. I have fully reviewed the prenatal and intrapartum course. Postpartum course has been uncomplicated. Baby's course has been uncomplicated. Baby is feeding by breast now bottle. Bleeding no bleeding. Bowel function is normal. Bladder function is normal. Patient is not sexually active. Last sexual activity: prior to birth of baby. Contraception method is abstinence and wants nexplanon. Postpartum depression screening: negative. Score 0.  Last pap 06/25/16 and was neg. Driving to [facility] today, grandmother has terminal cancer.   The following portions of the patient's history were reviewed and updated as appropriate: allergies, current medications, past medical history, past surgical history and problem list.  Review of Systems Pertinent items are noted in HPI.   Vitals:   03/14/17 1340  BP: 112/60  Pulse: 63  Weight: 107 lb (48.5 kg)  Height:  (1.499 m)   No LMP recorded.  Objective:   General:  alert, cooperative and no distress   Breasts:  deferred, no complaints  Lungs: clear to auscultation bilaterally  Heart:  regular rate and rhythm  Abdomen: soft, nontender, c/s incision healing well, still has a lot of dermabond, most of this removed    Vulva: normal  Vagina: normal vagina  Cervix:  closed  Corpus: Well-involuted  Adnexa:  Non-palpable  Rectal Exam: No hemorrhoids        Assessment:   Postpartum exam 6 wks s/p elective PLTCS d/t h/o 4th degree lac and PPH Bottlefeeding Depression screening Contraception counseling   Plan:  Contraception: abstinence until nexplanon insertion  Still at increased r/f DVT/PE, get out frequently and walk around Follow up in: 1 week for nexplanon insertion, or  earlier if needed  Marge Duncans CNM, [facility] 03/14/2017 1:52 PM

## 2017-03-14 NOTE — Patient Instructions (Signed)
NO SEX UNTIL AFTER YOU GET YOUR BIRTH CONTROL   Etonogestrel implant What is this medicine? ETONOGESTREL (et oh noe JES trel) is a contraceptive (birth control) device. It is used to prevent pregnancy. It can be used for up to 3 years. This medicine may be used for other purposes; ask your health care provider or pharmacist if you have questions. COMMON BRAND NAME(S): Implanon, Nexplanon What should I tell my health care provider before I take this medicine? They need to know if you have any of these conditions: -abnormal vaginal bleeding -blood vessel disease or blood clots -cancer of the breast, cervix, or liver -depression -diabetes -gallbladder disease -headaches -heart disease or recent heart attack -high blood pressure -high cholesterol -kidney disease -liver disease -renal disease -seizures -tobacco smoker -an unusual or allergic reaction to etonogestrel, other hormones, anesthetics or antiseptics, medicines, foods, dyes, or preservatives -pregnant or trying to get pregnant -breast-feeding How should I use this medicine? This device is inserted just under the skin on the inner side of your upper arm by a health care professional. Talk to your pediatrician regarding the use of this medicine in children. Special care may be needed. Overdosage: If you think you have taken too much of this medicine contact a poison control center or emergency room at once. NOTE: This medicine is only for you. Do not share this medicine with others. What if I miss a dose? This does not apply. What may interact with this medicine? Do not take this medicine with any of the following medications: -amprenavir -bosentan -fosamprenavir This medicine may also interact with the following medications: -barbiturate medicines for inducing sleep or treating seizures -certain medicines for fungal infections like ketoconazole and itraconazole -grapefruit juice -griseofulvin -medicines to treat  seizures like carbamazepine, felbamate, oxcarbazepine, phenytoin, topiramate -modafinil -phenylbutazone -rifampin -rufinamide -some medicines to treat HIV infection like atazanavir, indinavir, lopinavir, nelfinavir, tipranavir, ritonavir -St. John's wort This list may not describe all possible interactions. Give your health care provider a list of all the medicines, herbs, non-prescription drugs, or dietary supplements you use. Also tell them if you smoke, drink alcohol, or use illegal drugs. Some items may interact with your medicine. What should I watch for while using this medicine? This product does not protect you against HIV infection (AIDS) or other sexually transmitted diseases. You should be able to feel the implant by pressing your fingertips over the skin where it was inserted. Contact your doctor if you cannot feel the implant, and use a non-hormonal birth control method (such as condoms) until your doctor confirms that the implant is in place. If you feel that the implant may have broken or become bent while in your arm, contact your healthcare provider. What side effects may I notice from receiving this medicine? Side effects that you should report to your doctor or health care professional as soon as possible: -allergic reactions like skin rash, itching or hives, swelling of the face, lips, or tongue -breast lumps -changes in emotions or moods -depressed mood -heavy or prolonged menstrual bleeding -pain, irritation, swelling, or bruising at the insertion site -scar at site of insertion -signs of infection at the insertion site such as fever, and skin redness, pain or discharge -signs of pregnancy -signs and symptoms of a blood clot such as breathing problems; changes in vision; chest pain; severe, sudden headache; pain, swelling, warmth in the leg; trouble speaking; sudden numbness or weakness of the face, arm or leg -signs and symptoms of liver injury like dark yellow   or brown  urine; general ill feeling or flu-like symptoms; light-colored stools; loss of appetite; nausea; right upper belly pain; unusually weak or tired; yellowing of the eyes or skin -unusual vaginal bleeding, discharge -signs and symptoms of a stroke like changes in vision; confusion; trouble speaking or understanding; severe headaches; sudden numbness or weakness of the face, arm or leg; trouble walking; dizziness; loss of balance or coordination Side effects that usually do not require medical attention (report to your doctor or health care professional if they continue or are bothersome): -acne -back pain -breast pain -changes in weight -dizziness -general ill feeling or flu-like symptoms -headache -irregular menstrual bleeding -nausea -sore throat -vaginal irritation or inflammation This list may not describe all possible side effects. Call your doctor for medical advice about side effects. You may report side effects to FDA at 1-800-FDA-1088. Where should I keep my medicine? This drug is given in a hospital or clinic and will not be stored at home. NOTE: This sheet is a summary. It may not cover all possible information. If you have questions about this medicine, talk to your doctor, pharmacist, or health care provider.  2018 Elsevier/Gold Standard (2016-05-31 11:19:22)  

## 2017-03-18 ENCOUNTER — Emergency Department (HOSPITAL_COMMUNITY): Payer: Medicaid Other

## 2017-03-18 ENCOUNTER — Emergency Department (HOSPITAL_COMMUNITY)
Admission: EM | Admit: 2017-03-18 | Discharge: 2017-03-18 | Disposition: A | Discharge status: Home / Self Care | Payer: Medicaid Other | Attending: Emergency Medicine | Admitting: Emergency Medicine

## 2017-03-18 ENCOUNTER — Encounter (HOSPITAL_COMMUNITY): Payer: Self-pay | Admitting: Emergency Medicine

## 2017-03-18 DIAGNOSIS — Y92410 Unspecified street and highway as the place of occurrence of the external cause: Secondary | ICD-10-CM | POA: Insufficient documentation

## 2017-03-18 DIAGNOSIS — Z79899 Other long term (current) drug therapy: Secondary | ICD-10-CM | POA: Diagnosis not present

## 2017-03-18 DIAGNOSIS — S3992XA Unspecified injury of lower back, initial encounter: Secondary | ICD-10-CM | POA: Diagnosis present

## 2017-03-18 DIAGNOSIS — Y999 Unspecified external cause status: Secondary | ICD-10-CM | POA: Insufficient documentation

## 2017-03-18 DIAGNOSIS — S39012A Strain of muscle, fascia and tendon of lower back, initial encounter: Secondary | ICD-10-CM | POA: Insufficient documentation

## 2017-03-18 DIAGNOSIS — Y9389 Activity, other specified: Secondary | ICD-10-CM | POA: Insufficient documentation

## 2017-03-18 DIAGNOSIS — S8002XA Contusion of left knee, initial encounter: Secondary | ICD-10-CM | POA: Insufficient documentation

## 2017-03-18 LAB — POC URINE PREG, ED: PREG TEST UR: NEGATIVE

## 2017-03-18 MED ORDER — CYCLOBENZAPRINE HCL 10 MG PO TABS: 10.0000 mg | ORAL_TABLET | Freq: Two times a day (BID) | ORAL | 0 refills | Status: DC | PRN

## 2017-03-18 MED ORDER — NAPROXEN 500 MG PO TABS: 500.0000 mg | ORAL_TABLET | Freq: Two times a day (BID) | ORAL | 0 refills | Status: DC
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
## 2017-03-21 ENCOUNTER — Ambulatory Visit (INDEPENDENT_AMBULATORY_CARE_PROVIDER_SITE_OTHER): Payer: Medicaid Other | Admitting: Women's Health

## 2017-03-21 ENCOUNTER — Encounter: Payer: Self-pay | Admitting: Women's Health

## 2017-03-21 VITALS — BP 98/68 | HR 76 | Ht 59.0 in | Wt 104.2 lb

## 2017-03-21 DIAGNOSIS — Z01419 Encounter for gynecological examination (general) (routine) without abnormal findings: Secondary | ICD-10-CM | POA: Insufficient documentation

## 2017-03-21 DIAGNOSIS — Z3202 Encounter for pregnancy test, result negative: Secondary | ICD-10-CM

## 2017-03-21 DIAGNOSIS — Z98891 History of uterine scar from previous surgery: Secondary | ICD-10-CM

## 2017-03-21 DIAGNOSIS — Z3049 Encounter for surveillance of other contraceptives: Secondary | ICD-10-CM | POA: Diagnosis not present

## 2017-03-21 DIAGNOSIS — Z30017 Encounter for initial prescription of implantable subdermal contraceptive: Secondary | ICD-10-CM

## 2017-03-21 LAB — POCT URINE PREGNANCY: Preg Test, Ur: NEGATIVE

## 2017-03-21 NOTE — Patient Instructions (Signed)

## 2017-03-21 NOTE — Progress Notes (Signed)
Sylvia Day is a 31 y.o. year old Hispanic female here for Nexplanon insertion.  No LMP recorded., last sexual intercourse was prior to birth of baby, and her pregnancy test today was negative.  Risks/benefits/side effects of Nexplanon have been discussed and her questions have been answered.  Specifically, a failure rate of 11/998 has been reported, with an increased failure rate if pt takes St. John's Wort and/or antiseizure medicaitons.  [name] is aware of the common side effect of irregular bleeding, which the incidence of decreases over time.  BP 98/68 (BP Location: Right Arm, Patient Position: Sitting, Cuff Size: Normal)   Pulse 76   Ht  (1.499 m)   Wt 104 lb 3.2 oz (47.3 kg)   Breastfeeding? No   BMI 21.05 kg/m   Results for orders placed or performed in visit on 03/21/17 (from the past 24 hour(s))  POCT urine pregnancy   Collection Time: 03/21/17  9:19 AM  Result Value Ref Range   Preg Test, Ur Negative Negative     She is right-handed, so her left arm, approximately 4 inches proximal from the elbow, was cleansed with alcohol and anesthetized with 2cc of 2% Lidocaine.  The area was cleansed again with betadine and the Nexplanon was inserted per manufacturer's recommendations without difficulty.  3 steri-strips and pressure bandage were applied.  Pt was instructed to keep the area clean and dry, remove pressure bandage in 24 hours, and keep insertion site covered with the steri-strip for 3-5 days.  Back up contraception was recommended for 2 weeks.  She was given a card indicating date Nexplanon was inserted and date it needs to be removed. Follow-up PRN problems, and in for physical.   Marge Duncans CNM, [facility] 03/21/2017 9:24 AM

## 2017-04-01 ENCOUNTER — Telehealth: Payer: Self-pay | Admitting: *Deleted

## 2017-04-01 NOTE — Telephone Encounter (Signed)
Patient called with complaints of vaginal bleeding for 6 days along with cramping. She thinks she may have started her period. Advised patient that Nexplanon was just put in on 4/26 and to give it at least another week. Abnormal/irregular bleeding can occur with any type of BC but sometimes just needs a little time.  If bleeding does not slow down or get better to call us back. Pt verbalized understanding.

## 2017-06-20 ENCOUNTER — Encounter: Payer: Medicaid Other | Admitting: Adult Health

## 2017-06-21 NOTE — Progress Notes (Signed)
This encounter was created in error - please disregard.

## 2017-07-01 ENCOUNTER — Other Ambulatory Visit (HOSPITAL_COMMUNITY)
Admission: RE | Admit: 2017-07-01 | Discharge: 2017-07-01 | Disposition: A | Discharge status: Home / Self Care | Payer: Medicaid Other | Source: Ambulatory Visit | Attending: Adult Health | Admitting: Adult Health

## 2017-07-01 ENCOUNTER — Ambulatory Visit (INDEPENDENT_AMBULATORY_CARE_PROVIDER_SITE_OTHER): Payer: Medicaid Other | Admitting: Adult Health

## 2017-07-01 ENCOUNTER — Encounter: Payer: Self-pay | Admitting: Adult Health

## 2017-07-01 VITALS — BP 90/48 | HR 86 | Ht <= 58 in | Wt 103.0 lb

## 2017-07-01 DIAGNOSIS — Z3009 Encounter for other general counseling and advice on contraception: Secondary | ICD-10-CM

## 2017-07-01 DIAGNOSIS — L659 Nonscarring hair loss, unspecified: Secondary | ICD-10-CM

## 2017-07-01 DIAGNOSIS — Z975 Presence of (intrauterine) contraceptive device: Secondary | ICD-10-CM

## 2017-07-01 DIAGNOSIS — Z309 Encounter for contraceptive management, unspecified: Secondary | ICD-10-CM | POA: Diagnosis not present

## 2017-07-01 DIAGNOSIS — Z01419 Encounter for gynecological examination (general) (routine) without abnormal findings: Secondary | ICD-10-CM | POA: Insufficient documentation

## 2017-07-01 DIAGNOSIS — N921 Excessive and frequent menstruation with irregular cycle: Secondary | ICD-10-CM | POA: Insufficient documentation

## 2017-07-01 DIAGNOSIS — N920 Excessive and frequent menstruation with regular cycle: Secondary | ICD-10-CM | POA: Insufficient documentation

## 2017-07-01 MED ORDER — MEGESTROL ACETATE 40 MG PO TABS: ORAL_TABLET | ORAL | 1 refills | Status: DC

## 2017-07-01 NOTE — Progress Notes (Signed)
Patient ID: Sylvia Day, female   DOB: 1986-10-24, 31 y.o.   MRN: [id number] History of Present Illness: Sylvia Day is a Hispanic female, G2P2 in for well woman gyn exam and pap, she has family planning medicaid.She had Csection 01/30/17 and nexplanon insertion 03/21/17. And is complaining of hair loss and spotting and cramping with nexplanon.    Current Medications, Allergies, Past Medical History, Past Surgical History, Family History and Social History were reviewed in [facility] electronic medical record.     Review of Systems: Patient denies any headaches, hearing loss, fatigue, blurred vision, shortness of breath, chest pain, abdominal pain, problems with bowel movements, urination, or intercourse. No joint pain or mood swings. Hair loss, bleeding and cramping with nexplanon   Physical Exam:BP (!) 90/48 (BP Location: Left Arm, Patient Position: Sitting, Cuff Size: Normal)   Pulse 86   Ht 4' 9.5" (1.461 m)   Wt 103 lb (46.7 kg)   BMI 21.90 kg/m  General:  Well developed, well nourished, no acute distress Skin:  Warm and dry,has thinning of hair at temples. Neck:  Midline trachea, normal thyroid, good ROM, no lymphadenopathy Lungs; Clear to auscultation bilaterally Breast:  No dominant palpable mass, retraction, or nipple discharge Cardiovascular: Regular rate and rhythm Abdomen:  Soft, non tender, no hepatosplenomegaly Pelvic:  External genitalia is normal in appearance, no lesions.  The vagina is normal in appearance,with period like blood. Urethra has no lesions or masses. The cervix is smooth, pap with HPV and GC/CHL performed.  Uterus is felt to be normal size, shape, and contour.  No adnexal masses or tenderness noted.Bladder is non tender, no masses felt. Extremities/musculoskeletal:  No swelling or varicosities noted, no clubbing or cyanosis,nexplanon palpated in left arm Psych:  No mood changes, alert and cooperative,seems happy PHQ 2 score 0. Explained can have  irregular bleeding with nexplanon first 6 months and can have hair loss after pregnancy.   Impression: 1. Well woman exam with routine gynecological exam   2. Family planning   3. Nexplanon in place   4. Hair loss   5. Irregular intermenstrual bleeding       Plan: Pap with HPV and GC/CHL sent Check HIV and RPR Rx Megace 40 mg #60 take 2 daily til bleeding stops with 1 refill Physical in 1 year Pap in 3 if normal  See dermatologist if hair loss continues past christmas

## 2017-07-02 LAB — CYTOLOGY - PAP
CHLAMYDIA, DNA PROBE: NEGATIVE
DIAGNOSIS: NEGATIVE
HPV: NOT DETECTED
NEISSERIA GONORRHEA: NEGATIVE

## 2017-07-02 LAB — RPR: RPR Ser Ql: NONREACTIVE

## 2017-07-02 LAB — HIV ANTIBODY (ROUTINE TESTING W REFLEX): HIV SCREEN 4TH GENERATION: NONREACTIVE

## 2017-09-17 ENCOUNTER — Emergency Department (HOSPITAL_COMMUNITY)
Admission: EM | Admit: 2017-09-17 | Discharge: 2017-09-17 | Disposition: A | Discharge status: Home / Self Care | Payer: Self-pay | Attending: Emergency Medicine | Admitting: Emergency Medicine

## 2017-09-17 ENCOUNTER — Encounter (HOSPITAL_COMMUNITY): Payer: Self-pay | Admitting: Emergency Medicine

## 2017-09-17 DIAGNOSIS — Z885 Allergy status to narcotic agent status: Secondary | ICD-10-CM | POA: Insufficient documentation

## 2017-09-17 DIAGNOSIS — Z91040 Latex allergy status: Secondary | ICD-10-CM | POA: Insufficient documentation

## 2017-09-17 DIAGNOSIS — H10023 Other mucopurulent conjunctivitis, bilateral: Secondary | ICD-10-CM | POA: Insufficient documentation

## 2017-09-17 DIAGNOSIS — Z79899 Other long term (current) drug therapy: Secondary | ICD-10-CM | POA: Insufficient documentation

## 2017-09-17 MED ORDER — TOBRAMYCIN 0.3 % OP SOLN: 1.0000 [drp] | OPHTHALMIC | 0 refills | Status: AC

## 2017-09-17 NOTE — ED Triage Notes (Signed)
Patient c/o bilateral eye irritation, itching that started yesterday. Per patient yellow drainage and sensitivity to light. Denies any fevers or blurred vision.

## 2017-09-17 NOTE — ED Provider Notes (Addendum)
[facility] EMERGENCY DEPARTMENT Provider Note   CSN: [id number] Arrival date & time: 09/17/17  1055     History   Chief Complaint Chief Complaint  Patient presents with  . Eye Drainage    HPI Sylvia Day is a 31 y.o. female.  The history is provided by the patient.  Conjunctivitis  This is a new problem. The current episode started yesterday (reports her 347 month old is currently being treated for pink eye.). The problem occurs constantly. The problem has been gradually worsening (started in left eye yesterday, now in right as well). Pertinent negatives include no headaches. Associated symptoms comments: No fever, nasal congestion, sore throat.. Nothing aggravates the symptoms. Relieved by: cool compresses. She has tried a cold compress for the symptoms. The treatment provided mild relief.    Past Medical History:  Diagnosis Date  . Hives   . Medical history non-contributory   . Pregnant 06/04/2016    Patient Active Problem List   Diagnosis Date Noted  . Hair loss 07/01/2017  . Nexplanon in place 07/01/2017  . Spotting 07/01/2017  . Irregular intermenstrual bleeding 07/01/2017  . Well woman exam with routine gynecological exam 03/21/2017  . S/P primary low transverse C-section 01/30/2017  . Vulvovaginitis due to yeast 10/25/2016  . Hx of preeclampsia, prior pregnancy, currently pregnant 06/25/2016  . H/O maternal fourth degree perineal laceration, currently pregnant 06/25/2016  . History of postpartum hemorrhage, currently pregnant 06/25/2016    Past Surgical History:  Procedure Laterality Date  . CESAREAN SECTION N/A 01/30/2017   Procedure: PRIMARY CESAREAN SECTION;  Surgeon: Lazaro ArmsLuther H Eure, MD;  [facility];  Service: Obstetrics;  Laterality: N/A;  . NO PAST SURGERIES      OB History    Gravida Para Term Preterm AB Living   2 2 2     2    SAB TAB Ectopic Multiple Live Births         0 2       Home Medications    Prior to Admission  medications   Medication Sig Start Date End Date Taking? Authorizing Provider  Biotin (BIOTIN 5000) 5 MG CAPS Take by mouth.    [provider]  cetirizine (ZYRTEC) 10 MG tablet Take one daily for runny nose or drainage. Patient taking differently: Take 10 mg by mouth daily. Take one daily for runny nose or drainage. 12/30/15   Bobbitt, Heywood Ilesalph Carter, MD  cyclobenzaprine (FLEXERIL) 10 MG tablet Take 1 tablet (10 mg total) by mouth 2 (two) times daily as needed for muscle spasms. Patient not taking: Reported on 03/21/2017 03/18/17   Linwood DibblesKnapp, Jon, MD  EPINEPHrine 0.3 mg/0.3 mL IJ SOAJ injection Inject 0.3 mg into the muscle as needed (allergic reaction).     [provider]  etonogestrel (NEXPLANON) 68 MG IMPL implant 1 each by Subdermal route once.    [provider]  ibuprofen (ADVIL,MOTRIN) 200 MG tablet Take 200 mg by mouth every 6 (six) hours as needed for mild pain or moderate pain.    [provider]  megestrol (MEGACE) 40 MG tablet Take 2 daily 07/01/17   Cyril MourningGriffin, Jennifer A, NP  naproxen (NAPROSYN) 500 MG tablet Take 1 tablet (500 mg total) by mouth 2 (two) times daily. Patient not taking: Reported on 03/21/2017 03/18/17   Linwood DibblesKnapp, Jon, MD  Pediatric Multiple Vit-C-FA (FLINSTONES GUMMIES OMEGA-3 DHA PO) Take by mouth. Takes 2 daily    [provider]  tobramycin (TOBREX) 0.3 % ophthalmic solution Place 1  drop into both eyes every 4 (four) hours. 09/17/17 09/24/17  Burgess Amor, PA-C    Family History Family History  Problem Relation Age of Onset  . Stroke Father   . Stroke Maternal Grandmother   . Cancer Paternal Grandmother   . Other Paternal Grandfather        disabled    Social History Social History  Substance Use Topics  . Smoking status: Never Smoker  . Smokeless tobacco: Never Used  . Alcohol use Yes     Comment: occasional     Allergies   Latex and Percocet [oxycodone-acetaminophen]   Review of Systems Review of Systems    Neurological: Negative for headaches.     Physical Exam Updated Vital Signs BP 116/78 (BP Location: Right Arm)   Pulse 70   Temp 98.2 F (36.8 C) (Temporal)   Resp 18   Ht 4\' 11"  (1.499 m)   Wt 49.9 kg (110 lb)   SpO2 100%   BMI 22.22 kg/m   Physical Exam  Constitutional: She is oriented to person, place, and time. She appears well-developed and well-nourished.  HENT:  Head: Normocephalic and atraumatic.  Right Ear: Tympanic membrane and ear canal normal.  Left Ear: Tympanic membrane and ear canal normal.  Nose: No mucosal edema or rhinorrhea.  Mouth/Throat: Uvula is midline, oropharynx is clear and moist and mucous membranes are normal. No oropharyngeal exudate, posterior oropharyngeal edema, posterior oropharyngeal erythema or tonsillar abscesses.  Eyes: EOM are normal. Pupils are equal, round, and reactive to light. Right eye exhibits discharge. Left eye exhibits discharge. Right conjunctiva is injected. Left conjunctiva is injected.  Cardiovascular: Normal rate.  Pulmonary/Chest: Effort normal. No respiratory distress.  Musculoskeletal: Normal range of motion.  Neurological: She is alert and oriented to person, place, and time.  Skin: Skin is warm and dry. No rash noted.  Psychiatric: She has a normal mood and affect.     ED Treatments / Results  Labs (all labs ordered are listed, but only abnormal results are displayed) Labs Reviewed - No data to display  EKG  EKG Interpretation None       Radiology No results found.  Procedures Procedures (including critical care time)  Medications Ordered in ED Medications - No data to display   Initial Impression / Assessment and Plan / ED Course  I have reviewed the triage vital signs and the nursing notes.  Pertinent labs & imaging results that were available during my care of the patient were reviewed by me and considered in my medical decision making (see chart for details).    Exam c/w viral  conjunctivitis. Tobrex, compresses.  Discussed contagion. Prn f/u anticipated.  Final Clinical Impressions(s) / ED Diagnoses   Final diagnoses:  Pink eye disease of both eyes    New Prescriptions New Prescriptions   TOBRAMYCIN (TOBREX) 0.3 % OPHTHALMIC SOLUTION    Place 1 drop into both eyes every 4 (four) hours.     Burgess Amor, PA-C 09/17/17 1207    Samuel Jester, DO 09/20/17 1610    Burgess Amor, PA-C 10/01/17 1508    Samuel Jester, DO 10/01/17 1737

## 2017-12-22 ENCOUNTER — Emergency Department (HOSPITAL_COMMUNITY)
Admission: EM | Admit: 2017-12-22 | Discharge: 2017-12-23 | Disposition: A | Discharge status: Home / Self Care | Payer: Self-pay | Attending: Emergency Medicine | Admitting: Emergency Medicine

## 2017-12-22 ENCOUNTER — Encounter (HOSPITAL_COMMUNITY): Payer: Self-pay | Admitting: *Deleted

## 2017-12-22 ENCOUNTER — Other Ambulatory Visit: Payer: Self-pay

## 2017-12-22 DIAGNOSIS — N390 Urinary tract infection, site not specified: Secondary | ICD-10-CM | POA: Insufficient documentation

## 2017-12-22 DIAGNOSIS — Z202 Contact with and (suspected) exposure to infections with a predominantly sexual mode of transmission: Secondary | ICD-10-CM | POA: Insufficient documentation

## 2017-12-22 DIAGNOSIS — Z91040 Latex allergy status: Secondary | ICD-10-CM | POA: Insufficient documentation

## 2017-12-22 LAB — URINALYSIS, ROUTINE W REFLEX MICROSCOPIC
BILIRUBIN URINE: NEGATIVE
Bacteria, UA: NONE SEEN
Glucose, UA: NEGATIVE mg/dL
Ketones, ur: NEGATIVE mg/dL
NITRITE: NEGATIVE
PH: 6 (ref 5.0–8.0)
Protein, ur: NEGATIVE mg/dL
SPECIFIC GRAVITY, URINE: 1.026 (ref 1.005–1.030)

## 2017-12-22 LAB — PREGNANCY, URINE: PREG TEST UR: NEGATIVE

## 2017-12-22 NOTE — ED Triage Notes (Signed)
Pt reports having dysuria, lower back pain and feels like she is unable to empty her bladder since this Thursday.

## 2017-12-22 NOTE — ED Provider Notes (Signed)
[facility] EMERGENCY DEPARTMENT Provider Note   CSN: [id number] Arrival date & time: 12/22/17  2227     History   Chief Complaint Chief Complaint  Patient presents with  . Dysuria    HPI Sylvia Day is a 32 y.o. female who presents to the ED with dysuria and low back pain. Patient reports feeling like her bladder does not empty. Symptoms started 3 days ago. Patient also reports that she has been separated from her husband and they recently got back together and have been having unprotected sex. She knows that he was sexually active with other people and is afraid she may have an STI. She request testing for STI's.   The history is provided by the patient. No language interpreter was used.  Dysuria   The current episode started more than 2 days ago. The problem occurs every urination. The quality of the pain is described as burning. There has been no fever. She is sexually active. Associated symptoms include frequency, hematuria and urgency. Pertinent negatives include no chills, no nausea, no vomiting and no possible pregnancy. She has tried nothing for the symptoms.    Past Medical History:  Diagnosis Date  . Hives   . Medical history non-contributory     Patient Active Problem List   Diagnosis Date Noted  . Hair loss 07/01/2017  . Nexplanon in place 07/01/2017  . Spotting 07/01/2017  . Irregular intermenstrual bleeding 07/01/2017  . Well woman exam with routine gynecological exam 03/21/2017  . S/P primary low transverse C-section 01/30/2017  . Vulvovaginitis due to yeast 10/25/2016  . Hx of preeclampsia, prior pregnancy, currently pregnant 06/25/2016  . H/O maternal fourth degree perineal laceration, currently pregnant 06/25/2016  . History of postpartum hemorrhage, currently pregnant 06/25/2016    Past Surgical History:  Procedure Laterality Date  . CESAREAN SECTION N/A 01/30/2017   Procedure: PRIMARY CESAREAN SECTION;  Surgeon: Lazaro Arms, MD;  Location: [facility]  BIRTHING SUITES;  Service: Obstetrics;  Laterality: N/A;  . NO PAST SURGERIES      OB History    Gravida Para Term Preterm AB Living   2 2 2     2    SAB TAB Ectopic Multiple Live Births         0 2       Home Medications    Prior to Admission medications   Medication Sig Start Date End Date Taking? Authorizing Provider  Biotin (BIOTIN 5000) 5 MG CAPS Take 1 capsule by mouth daily.    Yes [provider]  cetirizine (ZYRTEC) 10 MG tablet Take one daily for runny nose or drainage. Patient taking differently: Take 10 mg by mouth daily as needed for allergies. Take one daily for runny nose or drainage. 12/30/15  Yes Bobbitt, Heywood Iles, MD  EPINEPHrine 0.3 mg/0.3 mL IJ SOAJ injection Inject 0.3 mg into the muscle as needed (allergic reaction).    Yes [provider]  etonogestrel (NEXPLANON) 68 MG IMPL implant 1 each by Subdermal route once.   Yes [provider]  megestrol (MEGACE) 40 MG tablet Take 2 daily Patient taking differently: Take 80 mg by mouth daily as needed (for bleeding).  07/01/17  Yes Cyril Mourning A, NP  cephALEXin (KEFLEX) 500 MG capsule Take 1 capsule (500 mg total) by mouth 4 (four) times daily. 12/23/17   Janne Napoleon, NP  phenazopyridine (PYRIDIUM) 200 MG tablet Take 1 tablet (200 mg total) by mouth 3 (three) times daily. 12/23/17   Kerrie Buffalo  M, NP    Family History Family History  Problem Relation Age of Onset  . Stroke Father   . Stroke Maternal Grandmother   . Cancer Paternal Grandmother   . Other Paternal Grandfather        disabled    Social History Social History   Tobacco Use  . Smoking status: Never Smoker  . Smokeless tobacco: Never Used  Substance Use Topics  . Alcohol use: No    Frequency: Never    Comment: occasional  . Drug use: No     Allergies   Latex and Percocet [oxycodone-acetaminophen]   Review of Systems Review of Systems  Constitutional: Negative for chills.  HENT: Negative.     Respiratory: Negative for cough.   Cardiovascular: Negative for chest pain.  Gastrointestinal: Positive for abdominal pain. Negative for nausea and vomiting.  Genitourinary: Positive for dysuria, frequency, hematuria, pelvic pain, urgency, vaginal bleeding (spotting) and vaginal discharge.  Musculoskeletal: Positive for back pain. Negative for neck pain.  Skin: Negative for rash.  Neurological: Negative for headaches.  Psychiatric/Behavioral: Negative for confusion.     Physical Exam Updated Vital Signs BP (!) 127/98 (BP Location: Right Arm)   Pulse 87   Temp (!) 97.5 F (36.4 C) (Oral)   Resp 15   Ht 4\' 11"  (1.499 m)   Wt 52.2 kg (115 lb)   SpO2 100%   BMI 23.23 kg/m   Physical Exam  Constitutional: She appears well-developed and well-nourished. No distress.  HENT:  Head: Normocephalic and atraumatic.  Eyes: Conjunctivae and EOM are normal.  Neck: Normal range of motion. Neck supple.  Cardiovascular: Normal rate and regular rhythm.  Pulmonary/Chest: Effort normal and breath sounds normal.  Abdominal: Soft. Bowel sounds are normal. There is tenderness in the suprapubic area. There is no CVA tenderness.  Genitourinary:  Genitourinary Comments: External genitalia without lesions, frothy d/c vaginal vault, cervix inflamed, positive CMT, mild adnexal tenderness, no mass palpated. Uterus not enlarged.   Musculoskeletal: Normal range of motion.  Neurological: She is alert.  Skin: Skin is warm and dry.  Psychiatric: She has a normal mood and affect. Her behavior is normal.  Nursing note and vitals reviewed.    ED Treatments / Results  Labs (all labs ordered are listed, but only abnormal results are displayed) Labs Reviewed  WET PREP, GENITAL - Abnormal; Notable for the following components:      Result Value   WBC, Wet Prep HPF POC MANY (*)    All other components within normal limits  URINALYSIS, ROUTINE W REFLEX MICROSCOPIC - Abnormal; Notable for the following  components:   APPearance HAZY (*)    Hgb urine dipstick MODERATE (*)    Leukocytes, UA TRACE (*)    Squamous Epithelial / LPF 0-5 (*)    All other components within normal limits  PREGNANCY, URINE  GC/CHLAMYDIA PROBE AMP ([place]) NOT AT [facility]    Radiology No results found.  Procedures Pelvic exam Date/Time: 12/23/2017 12:28 AM Performed by: Janne Napoleon, NP Authorized by: Janne Napoleon, NP  Consent: Verbal consent obtained. Risks and benefits: risks, benefits and alternatives were discussed Consent given by: patient Required items: required blood products, implants, devices, and special equipment available Patient identity confirmed: verbally with patient Preparation: patient draped and set up for pelvic. Local anesthesia used: no  Anesthesia: Local anesthesia used: no  Sedation: Patient sedated: no  Patient tolerance: Patient tolerated the procedure well with no immediate complications    (including critical care  time)  Medications Ordered in ED Medications  lidocaine (XYLOCAINE) 2 % injection (not administered)  cefTRIAXone (ROCEPHIN) injection 250 mg (250 mg Intramuscular Given 12/23/17 0016)  azithromycin (ZITHROMAX) tablet 1,000 mg (1,000 mg Oral Given 12/23/17 0016)     Initial Impression / Assessment and Plan / ED Course  I have reviewed the triage vital signs and the nursing notes. Pt presents with concerns for possible STD.  Pt understands that they have GC/Chlamydia cultures pending and that they will need to inform all sexual partners if results return positive. Pt has been treated prophylactically with azithromycin and Rocephin due to pts history, pelvic exam, and wet prep with increased WBCs.  Patient to be discharged with instructions to follow up with OBGYN/PCP. Discussed importance of using protection when sexually active.  Pt has been diagnosed with a UTI. Pt is afebrile, no CVA tenderness, normotensive, and denies N/V. Pt to be dc home with  antibiotics. Urine sent for culture.   Final Clinical Impressions(s) / ED Diagnoses   Final diagnoses:  Lower urinary tract infectious disease  Potential exposure to STD    ED Discharge Orders        Ordered    cephALEXin (KEFLEX) 500 MG capsule  4 times daily     12/23/17 0026    phenazopyridine (PYRIDIUM) 200 MG tablet  3 times daily     12/23/17 0028       Damian Leavelleese, [place] M, NP 12/23/17 0030    Marily MemosMesner, Jason, MD 01/01/18 1052

## 2017-12-23 LAB — WET PREP, GENITAL
Clue Cells Wet Prep HPF POC: NONE SEEN
Sperm: NONE SEEN
TRICH WET PREP: NONE SEEN
Yeast Wet Prep HPF POC: NONE SEEN

## 2017-12-23 MED ORDER — PHENAZOPYRIDINE HCL 200 MG PO TABS
200.0000 mg | ORAL_TABLET | Freq: Three times a day (TID) | ORAL | 0 refills | Status: DC
Start: 2017-12-23 — End: 2019-03-18

## 2017-12-23 MED ORDER — AZITHROMYCIN 250 MG PO TABS
1000.0000 mg | ORAL_TABLET | Freq: Once | ORAL | Status: AC
  Administered 2017-12-23: 1000 mg via ORAL
  Filled 2017-12-23: qty 4

## 2017-12-23 MED ORDER — LIDOCAINE HCL (PF) 2 % IJ SOLN
INTRAMUSCULAR | Status: AC
  Filled 2017-12-23: qty 10

## 2017-12-23 MED ORDER — CEPHALEXIN 500 MG PO CAPS: 500.0000 mg | ORAL_CAPSULE | Freq: Four times a day (QID) | ORAL | 0 refills | Status: DC

## 2017-12-23 MED ORDER — CEFTRIAXONE SODIUM 250 MG IJ SOLR
250.0000 mg | Freq: Once | INTRAMUSCULAR | Status: AC
  Administered 2017-12-23: 250 mg via INTRAMUSCULAR
  Filled 2017-12-23: qty 250

## 2017-12-23 NOTE — Discharge Instructions (Signed)
The cultures will not be back for 24 hours. Someone will call you if you need additional treatment. Follow up with your doctor. Return here as needed.

## 2017-12-24 LAB — GC/CHLAMYDIA PROBE AMP (~~LOC~~) NOT AT ARMC
Chlamydia: NEGATIVE
Neisseria Gonorrhea: NEGATIVE

## 2017-12-26 ENCOUNTER — Emergency Department (HOSPITAL_COMMUNITY)
Admission: EM | Admit: 2017-12-26 | Discharge: 2017-12-26 | Disposition: A | Discharge status: Home / Self Care | Payer: Self-pay | Attending: Emergency Medicine | Admitting: Emergency Medicine

## 2017-12-26 DIAGNOSIS — R300 Dysuria: Secondary | ICD-10-CM

## 2017-12-26 DIAGNOSIS — N3090 Cystitis, unspecified without hematuria: Secondary | ICD-10-CM | POA: Insufficient documentation

## 2017-12-26 DIAGNOSIS — Z91040 Latex allergy status: Secondary | ICD-10-CM | POA: Insufficient documentation

## 2017-12-26 LAB — URINALYSIS, ROUTINE W REFLEX MICROSCOPIC
BILIRUBIN URINE: NEGATIVE
GLUCOSE, UA: NEGATIVE mg/dL
KETONES UR: NEGATIVE mg/dL
LEUKOCYTES UA: NEGATIVE
NITRITE: NEGATIVE
PH: 5 (ref 5.0–8.0)
PROTEIN: NEGATIVE mg/dL
Specific Gravity, Urine: 1.017 (ref 1.005–1.030)

## 2017-12-26 LAB — PREGNANCY, URINE: Preg Test, Ur: NEGATIVE

## 2017-12-26 MED ORDER — IBUPROFEN 600 MG PO TABS: 600.0000 mg | ORAL_TABLET | Freq: Four times a day (QID) | ORAL | 0 refills | Status: DC | PRN

## 2017-12-26 MED ORDER — ONDANSETRON 8 MG PO TBDP: 8.0000 mg | ORAL_TABLET | Freq: Three times a day (TID) | ORAL | 0 refills | Status: DC | PRN

## 2017-12-26 MED ORDER — CEPHALEXIN 500 MG PO CAPS
500.0000 mg | ORAL_CAPSULE | Freq: Four times a day (QID) | ORAL | 0 refills | Status: DC
Start: 2017-12-26 — End: 2019-03-18

## 2017-12-26 NOTE — ED Triage Notes (Signed)
Pt states she was here Sunday and diagnosed with UTI. Still taking prescribed antibiotic(cephalexin) but pain has gotten worse and now c/o of hematuria and lower back pain.

## 2017-12-26 NOTE — Discharge Instructions (Signed)
We still suspect that your symptoms are due to cystitis, possibly kidney infection. We are giving you antibiotics to cover you for kidney infection. Please see your primary care doctor for optimal evaluation of your pain.  Please return to the ER if your symptoms worsen; you have increased pain, fevers, chills, inability to keep any medications down, confusion. Otherwise see the outpatient doctor as requested.

## 2017-12-27 NOTE — ED Provider Notes (Signed)
[facility]ERGENCY DEPARTMENT Provider Note   CSN: [id number] Arrival date & time: 12/26/17  1126     History   Chief Complaint Chief Complaint  Patient presents with  . Urinary Tract Infection    HPI Sylvia Day is a 32 y.o. female.  HPI 32 year old female comes in with chief complaint of burning with urination. Patient was seen in the ER 4 days ago, with complaint of dysuria.  Patient had a pelvic exam, and was empirically treated for GC and chlamydia.  Patient was also started on Keflex.  Patient reports that her symptoms initially improved while she was taking Azo, however her symptoms have now returned.  She still taking the antibiotics as prescribed.  Patient is also having some pain in the left flank region.  Patient denies any associated nausea, vomiting, fevers, chills, vaginal discharge, vaginal bleeding -except for occasional spotting.  Patient states that her pelvic exam was not significantly uncomfortable.  Past Medical History:  Diagnosis Date  . Hives   . Medical history non-contributory     Patient Active Problem List   Diagnosis Date Noted  . Hair loss 07/01/2017  . Nexplanon in place 07/01/2017  . Spotting 07/01/2017  . Irregular intermenstrual bleeding 07/01/2017  . Well woman exam with routine gynecological exam 03/21/2017  . S/P primary low transverse C-section 01/30/2017  . Vulvovaginitis due to yeast 10/25/2016  . Hx of preeclampsia, prior pregnancy, currently pregnant 06/25/2016  . H/O maternal fourth degree perineal laceration, currently pregnant 06/25/2016  . History of postpartum hemorrhage, currently pregnant 06/25/2016    Past Surgical History:  Procedure Laterality Date  . CESAREAN SECTION N/A 01/30/2017   Procedure: PRIMARY CESAREAN SECTION;  Surgeon: Lazaro ArmsLuther H Eure, MD;  [facility];  Service: Obstetrics;  Laterality: N/A;  . NO PAST SURGERIES      OB History    Gravida Para Term Preterm AB Living   2 2 2     2      SAB TAB Ectopic Multiple Live Births         0 2       Home Medications    Prior to Admission medications   Medication Sig Start Date End Date Taking? Authorizing Provider  Biotin (BIOTIN 5000) 5 MG CAPS Take 1 capsule by mouth daily.    Yes [provider]  cephALEXin (KEFLEX) 500 MG capsule Take 1 capsule (500 mg total) by mouth 4 (four) times daily. 12/23/17  Yes Neese, Hope M, NP  etonogestrel (NEXPLANON) 68 MG IMPL implant 1 each by Subdermal route once.   Yes [provider]  megestrol (MEGACE) 40 MG tablet Take 2 daily Patient taking differently: Take 80 mg by mouth daily as needed (for bleeding).  07/01/17  Yes Adline PotterGriffin, Jennifer A, NP  phenazopyridine (PYRIDIUM) 200 MG tablet Take 1 tablet (200 mg total) by mouth 3 (three) times daily. 12/23/17  Yes Neese, Hope M, NP  cephALEXin (KEFLEX) 500 MG capsule Take 1 capsule (500 mg total) by mouth 4 (four) times daily. 12/26/17   Derwood KaplanNanavati, Din Bookwalter, MD  cetirizine (ZYRTEC) 10 MG tablet Take one daily for runny nose or drainage. Patient taking differently: Take 10 mg by mouth daily as needed for allergies. Take one daily for runny nose or drainage. 12/30/15   Bobbitt, Heywood Ilesalph Carter, MD  EPINEPHrine 0.3 mg/0.3 mL IJ SOAJ injection Inject 0.3 mg into the muscle as needed (allergic reaction).     [provider]  ibuprofen (ADVIL,MOTRIN) 600 MG tablet Take  1 tablet (600 mg total) by mouth every 6 (six) hours as needed. 12/26/17   Derwood Kaplan, MD  ondansetron (ZOFRAN ODT) 8 MG disintegrating tablet Take 1 tablet (8 mg total) by mouth every 8 (eight) hours as needed for nausea. 12/26/17   Derwood Kaplan, MD    Family History Family History  Problem Relation Age of Onset  . Stroke Father   . Stroke Maternal Grandmother   . Cancer Paternal Grandmother   . Other Paternal Grandfather        disabled    Social History Social History   Tobacco Use  . Smoking status: Never Smoker  . Smokeless tobacco: Never Used   Substance Use Topics  . Alcohol use: No    Frequency: Never    Comment: occasional  . Drug use: No     Allergies   Latex and Percocet [oxycodone-acetaminophen]   Review of Systems Review of Systems  All other systems reviewed and are negative.    Physical Exam Updated Vital Signs BP 124/68   Pulse 88   Temp 98 F (36.7 C) (Oral)   Resp 16   Ht 4\' 11"  (1.499 m)   Wt 52.2 kg (115 lb)   SpO2 98%   BMI 23.23 kg/m   Physical Exam  Constitutional: She is oriented to person, place, and time. She appears well-developed.  HENT:  Head: Normocephalic and atraumatic.  Eyes: EOM are normal.  Neck: Normal range of motion. Neck supple.  Cardiovascular: Normal rate.  Pulmonary/Chest: Effort normal.  Abdominal: Bowel sounds are normal. There is tenderness. There is no rebound and no guarding.  Suprapubic tenderness only.  Neurological: She is alert and oriented to person, place, and time.  Skin: Skin is warm and dry.  Nursing note and vitals reviewed.    ED Treatments / Results  Labs (all labs ordered are listed, but only abnormal results are displayed) Labs Reviewed  URINALYSIS, ROUTINE W REFLEX MICROSCOPIC - Abnormal; Notable for the following components:      Result Value   Hgb urine dipstick SMALL (*)    Bacteria, UA RARE (*)    Squamous Epithelial / LPF 0-5 (*)    All other components within normal limits  URINE CULTURE  PREGNANCY, URINE    EKG  EKG Interpretation None       Radiology No results found.  Procedures Procedures (including critical care time)  Medications Ordered in ED Medications - No data to display   Initial Impression / Assessment and Plan / ED Course  I have reviewed the triage vital signs and the nursing notes.  Pertinent labs & imaging results that were available during my care of the patient were reviewed by me and considered in my medical decision making (see chart for details).    32 year old female comes in with chief  complaint of burning with urination.  Patient was recently seen in the ER, and empirically treated for [facility] and chlamydia after pelvic exam.  Patient's STD swab is negative.  Patient was also started on Keflex for potential cystitis.  Patient's symptoms have returned after she completed the Azo, and on my history she is also complaining of left-sided flank pain.  Abdominal exam is not peritoneal, and tenderness is limited to suprapubic region only.  I still suspect that patient is having cystitis.  We will get urine cultures.  No indication for repeat pelvic exam.  Patient and I discussed potential for her requiring ultrasound if her symptoms continue to get worse.  In the interim, we have increased her Keflex duration 2 cover for pyelonephritis.  Patient will return to the ER if her symptoms continue to get worse, or she starts developing new fevers, chills, vaginal discharge or bleeding.  Final Clinical Impressions(s) / ED Diagnoses   Final diagnoses:  Dysuria  Cystitis    ED Discharge Orders        Ordered    cephALEXin (KEFLEX) 500 MG capsule  4 times daily     12/26/17 1455    ondansetron (ZOFRAN ODT) 8 MG disintegrating tablet  Every 8 hours PRN     12/26/17 1455    ibuprofen (ADVIL,MOTRIN) 600 MG tablet  Every 6 hours PRN     12/26/17 1455       Derwood Kaplan, MD 12/27/17 2333

## 2017-12-28 LAB — URINE CULTURE
CULTURE: NO GROWTH
Special Requests: NORMAL

## 2018-06-18 ENCOUNTER — Emergency Department (HOSPITAL_COMMUNITY): Payer: Self-pay

## 2018-06-18 ENCOUNTER — Other Ambulatory Visit: Payer: Self-pay

## 2018-06-18 ENCOUNTER — Encounter (HOSPITAL_COMMUNITY): Payer: Self-pay | Admitting: *Deleted

## 2018-06-18 ENCOUNTER — Emergency Department (HOSPITAL_COMMUNITY)
Admission: EM | Admit: 2018-06-18 | Discharge: 2018-06-18 | Disposition: A | Discharge status: Home / Self Care | Payer: Self-pay | Attending: Emergency Medicine | Admitting: Emergency Medicine

## 2018-06-18 DIAGNOSIS — R519 Headache, unspecified: Secondary | ICD-10-CM

## 2018-06-18 DIAGNOSIS — Z91040 Latex allergy status: Secondary | ICD-10-CM | POA: Insufficient documentation

## 2018-06-18 DIAGNOSIS — Z79899 Other long term (current) drug therapy: Secondary | ICD-10-CM | POA: Insufficient documentation

## 2018-06-18 DIAGNOSIS — R51 Headache: Secondary | ICD-10-CM | POA: Insufficient documentation

## 2018-06-18 MED ORDER — METHOCARBAMOL 500 MG PO TABS: 500.0000 mg | ORAL_TABLET | Freq: Two times a day (BID) | ORAL | 0 refills | Status: DC

## 2018-06-18 MED ORDER — METOCLOPRAMIDE HCL 10 MG PO TABS
5.0000 mg | ORAL_TABLET | Freq: Once | ORAL | Status: AC
  Administered 2018-06-18: 5 mg via ORAL
  Filled 2018-06-18: qty 1

## 2018-06-18 MED ORDER — KETOROLAC TROMETHAMINE 30 MG/ML IJ SOLN
30.0000 mg | Freq: Once | INTRAMUSCULAR | Status: AC
  Administered 2018-06-18: 30 mg via INTRAMUSCULAR
  Filled 2018-06-18: qty 1

## 2018-06-18 NOTE — ED Provider Notes (Signed)
[facility] EMERGENCY DEPARTMENT Provider Note   CSN: [id number] Arrival date & time: 06/18/18  1329     History   Chief Complaint Chief Complaint  Patient presents with  . Headache    HPI Sylvia Day is a 32 y.o. female.  HPI   32 year old female presents to with headache. Patient notes over the weekend she notes waking up with soreness in her left lateral neck. She notes her symptoms had improved, she went to a concert over the weekend and woke up the next day with a slow onset throbbing sensation in the left side of her head. She notes since that time she has had light sensitivity, and continued pain. She denies any neurological deficits, neck stiffness, fever, or any other red flags. Patient notes that symptoms are improved at home with ibuprofen but then the return. She denies any significant past medical history of migraines or severe headaches.  Past Medical History:  Diagnosis Date  . Hives   . Medical history non-contributory     Patient Active Problem List   Diagnosis Date Noted  . Hair loss 07/01/2017  . Nexplanon in place 07/01/2017  . Spotting 07/01/2017  . Irregular intermenstrual bleeding 07/01/2017  . Well woman exam with routine gynecological exam 03/21/2017  . S/P primary low transverse C-section 01/30/2017  . Vulvovaginitis due to yeast 10/25/2016  . Hx of preeclampsia, prior pregnancy, currently pregnant 06/25/2016  . H/O maternal fourth degree perineal laceration, currently pregnant 06/25/2016  . History of postpartum hemorrhage, currently pregnant 06/25/2016    Past Surgical History:  Procedure Laterality Date  . CESAREAN SECTION N/A 01/30/2017   Procedure: PRIMARY CESAREAN SECTION;  Surgeon: Lazaro Arms, MD;  Location: [facility] BIRTHING SUITES;  Service: Obstetrics;  Laterality: N/A;  . NO PAST SURGERIES       OB History    Gravida  2   Para  2   Term  2   Preterm      AB      Living  2     SAB      TAB      Ectopic      Multiple  0   Live Births  2            Home Medications    Prior to Admission medications   Medication Sig Start Date End Date Taking? Authorizing Provider  Biotin (BIOTIN 5000) 5 MG CAPS Take 1 capsule by mouth daily.     [provider]  cephALEXin (KEFLEX) 500 MG capsule Take 1 capsule (500 mg total) by mouth 4 (four) times daily. 12/23/17   Janne Napoleon, NP  cephALEXin (KEFLEX) 500 MG capsule Take 1 capsule (500 mg total) by mouth 4 (four) times daily. 12/26/17   Derwood Kaplan, MD  cetirizine (ZYRTEC) 10 MG tablet Take one daily for runny nose or drainage. Patient taking differently: Take 10 mg by mouth daily as needed for allergies. Take one daily for runny nose or drainage. 12/30/15   Bobbitt, Heywood Iles, MD  EPINEPHrine 0.3 mg/0.3 mL IJ SOAJ injection Inject 0.3 mg into the muscle as needed (allergic reaction).     [provider]  etonogestrel (NEXPLANON) 68 MG IMPL implant 1 each by Subdermal route once.    [provider]  ibuprofen (ADVIL,MOTRIN) 600 MG tablet Take 1 tablet (600 mg total) by mouth every 6 (six) hours as needed. 12/26/17   Derwood Kaplan, MD  megestrol (MEGACE) 40 MG tablet Take 2  daily Patient taking differently: Take 80 mg by mouth daily as needed (for bleeding).  07/01/17   Adline Potter, NP  methocarbamol (ROBAXIN) 500 MG tablet Take 1 tablet (500 mg total) by mouth 2 (two) times daily. 06/18/18   Lynnsey Barbara, Tinnie Gens, PA-C  ondansetron (ZOFRAN ODT) 8 MG disintegrating tablet Take 1 tablet (8 mg total) by mouth every 8 (eight) hours as needed for nausea. 12/26/17   Derwood Kaplan, MD  phenazopyridine (PYRIDIUM) 200 MG tablet Take 1 tablet (200 mg total) by mouth 3 (three) times daily. 12/23/17   Janne Napoleon, NP    Family History Family History  Problem Relation Age of Onset  . Stroke Father   . Stroke Maternal Grandmother   . Cancer Paternal Grandmother   . Other Paternal Grandfather        disabled     Social History Social History   Tobacco Use  . Smoking status: Never Smoker  . Smokeless tobacco: Never Used  Substance Use Topics  . Alcohol use: No    Frequency: Never    Comment: occasional  . Drug use: No     Allergies   Latex and Percocet [oxycodone-acetaminophen]   Review of Systems Review of Systems  All other systems reviewed and are negative.    Physical Exam Updated Vital Signs BP 128/78 (BP Location: Left Arm)   Pulse 69   Temp 98.1 F (36.7 C) (Oral)   Resp 16   SpO2 100%   Physical Exam  Constitutional: She is oriented to person, place, and time. She appears well-developed and well-nourished.  HENT:  Head: Normocephalic and atraumatic.  Eyes: Pupils are equal, round, and reactive to light. Conjunctivae are normal. Right eye exhibits no discharge. Left eye exhibits no discharge. No scleral icterus.  Neck: Normal range of motion. No JVD present. No tracheal deviation present.  Pulmonary/Chest: Effort normal. No stridor.  Neurological: She is alert and oriented to person, place, and time. She has normal strength. Coordination and gait normal. GCS eye subscore is 4. GCS verbal subscore is 5. GCS motor subscore is 6.  Psychiatric: She has a normal mood and affect. Her behavior is normal. Judgment and thought content normal.  Nursing note and vitals reviewed.    ED Treatments / Results  Labs (all labs ordered are listed, but only abnormal results are displayed) Labs Reviewed - No data to display  EKG None  Radiology Ct Head Wo Contrast  Result Date: 06/18/2018 CLINICAL DATA:  ma acute headache. Headache for the last 3 days, worsening. EXAM: CT HEAD WITHOUT CONTRAST TECHNIQUE: Contiguous axial images were obtained from the base of the skull through the vertex without intravenous contrast. COMPARISON:  Head CT dated 05/11/2011. FINDINGS: Brain: Ventricles are within normal limits. All areas of the brain demonstrate appropriate gray-white matter  differentiation. There is no mass, hemorrhage, edema or other evidence of acute parenchymal abnormality. No extra-axial hemorrhage. Vascular: No hyperdense vessel or unexpected calcification. Skull: Normal. Negative for fracture or focal lesion. Sinuses/Orbits: No acute finding. Other: None. IMPRESSION: Negative head CT.  No intracranial mass, hemorrhage or edema. Electronically Signed   By: Bary Richard M.D.   On: 06/18/2018 17:13    Procedures Procedures (including critical care time)  Medications Ordered in ED Medications  ketorolac (TORADOL) 30 MG/ML injection 30 mg (30 mg Intramuscular Given 06/18/18 1646)  metoCLOPramide (REGLAN) tablet 5 mg (5 mg Oral Given 06/18/18 1646)     Initial Impression / Assessment and Plan / ED Course  I have reviewed the triage vital signs and the nursing notes.  Pertinent labs & imaging results that were available during my care of the patient were reviewed by me and considered in my medical decision making (see chart for details).     32 year old female presents today with uncomplicated headache, resolved with Toradol and Reglan. Patient with no past medical history of headaches, head CT was ordered which showed no acute findings. Patient well-appearing acute distress, stable for outpatient follow-up with strict return cautioned P Richard verbalized understanding and agreement to today's plan had no further questions or concerns at time of discharge.  Final Clinical Impressions(s) / ED Diagnoses   Final diagnoses:  Acute nonintractable headache, unspecified headache type    ED Discharge Orders        Ordered    methocarbamol (ROBAXIN) 500 MG tablet  2 times daily     06/18/18 1844       Eyvonne MechanicHedges, Vika Buske, PA-C 06/18/18 1844    Linwood DibblesKnapp, Jon, MD 06/18/18 [phone]

## 2018-06-18 NOTE — ED Triage Notes (Signed)
Pt in c/o headache for the last few days, states it started as a pain in her neck and pain has gotten worse, denies improvement with ibuprofen at home

## 2018-06-18 NOTE — ED Notes (Signed)
Patient transported to CT 

## 2018-06-18 NOTE — Discharge Instructions (Addendum)
Please read attached information. If you experience any new or worsening signs or symptoms please return to the emergency room for evaluation. Please follow-up with your primary care provider or specialist as discussed.  °

## 2018-10-27 ENCOUNTER — Other Ambulatory Visit: Payer: Self-pay

## 2018-10-27 ENCOUNTER — Encounter (HOSPITAL_COMMUNITY): Payer: Self-pay | Admitting: *Deleted

## 2018-10-27 ENCOUNTER — Emergency Department (HOSPITAL_COMMUNITY)
Admission: EM | Admit: 2018-10-27 | Discharge: 2018-10-28 | Disposition: A | Discharge status: Home / Self Care | Payer: Self-pay | Attending: Emergency Medicine | Admitting: Emergency Medicine

## 2018-10-27 DIAGNOSIS — Z79899 Other long term (current) drug therapy: Secondary | ICD-10-CM | POA: Insufficient documentation

## 2018-10-27 DIAGNOSIS — R05 Cough: Secondary | ICD-10-CM | POA: Insufficient documentation

## 2018-10-27 DIAGNOSIS — R059 Cough, unspecified: Secondary | ICD-10-CM

## 2018-10-27 NOTE — ED Triage Notes (Signed)
Pt reports nasal congestion and dry cough that started about a month ago. She is still having a cough. Has been using some OTC meds without relief. No fevers.

## 2018-10-28 ENCOUNTER — Emergency Department (HOSPITAL_COMMUNITY): Payer: Self-pay

## 2018-10-28 MED ORDER — GUAIFENESIN 100 MG/5ML PO SYRP: 100.0000 mg | ORAL_SOLUTION | ORAL | 0 refills | Status: DC | PRN

## 2018-10-28 MED ORDER — BENZONATATE 100 MG PO CAPS: 100.0000 mg | ORAL_CAPSULE | Freq: Three times a day (TID) | ORAL | 0 refills | Status: DC

## 2018-10-28 MED ORDER — BENZONATATE 100 MG PO CAPS
100.0000 mg | ORAL_CAPSULE | Freq: Once | ORAL | Status: AC
  Administered 2018-10-28: 100 mg via ORAL
  Filled 2018-10-28: qty 1

## 2018-10-28 NOTE — ED Notes (Signed)
Patient transported to X-ray 

## 2018-10-28 NOTE — ED Provider Notes (Signed)
[facility] EMERGENCY DEPARTMENT Provider Note   CSN: [id number] Arrival date & time: 10/27/18  2124     History   Chief Complaint Chief Complaint  Patient presents with  . Cough    HPI Sylvia Day is a 32 y.o. female.  The history is provided by the patient. No language interpreter was used.  Cough      32 year old female presenting for evaluation of a cough.  Patient endorsed a dry cough ongoing for nearly a month.  Initially she has some sinus congestion, occasional sneezing and throat irritation but now her cough still lingers.  She coughs throughout the day, mildly improved with taking cough drops and over-the-counter medication.  She does endorse shortness of breath only when she coughs a significant amount.  She does not endorse exertional shortness of breath.  No report of fever or chills, no nausea vomiting diarrhea, no hemoptysis.  No prior history of PE or DVT.  She does not complain of any wheezing.  Past Medical History:  Diagnosis Date  . Hives   . Medical history non-contributory     Patient Active Problem List   Diagnosis Date Noted  . Hair loss 07/01/2017  . Nexplanon in place 07/01/2017  . Spotting 07/01/2017  . Irregular intermenstrual bleeding 07/01/2017  . Well woman exam with routine gynecological exam 03/21/2017  . S/P primary low transverse C-section 01/30/2017  . Vulvovaginitis due to yeast 10/25/2016  . Hx of preeclampsia, prior pregnancy, currently pregnant 06/25/2016  . H/O maternal fourth degree perineal laceration, currently pregnant 06/25/2016  . History of postpartum hemorrhage, currently pregnant 06/25/2016    Past Surgical History:  Procedure Laterality Date  . CESAREAN SECTION N/A 01/30/2017   Procedure: PRIMARY CESAREAN SECTION;  Surgeon: Lazaro Arms, MD;  Location: [facility] BIRTHING SUITES;  Service: Obstetrics;  Laterality: N/A;  . NO PAST SURGERIES       OB History    Gravida  2   Para  2   Term  2   Preterm      AB      Living  2     SAB      TAB      Ectopic      Multiple  0   Live Births  2            Home Medications    Prior to Admission medications   Medication Sig Start Date End Date Taking? Authorizing Provider  Biotin (BIOTIN 5000) 5 MG CAPS Take 1 capsule by mouth daily.     [provider]  cephALEXin (KEFLEX) 500 MG capsule Take 1 capsule (500 mg total) by mouth 4 (four) times daily. 12/23/17   Janne Napoleon, NP  cephALEXin (KEFLEX) 500 MG capsule Take 1 capsule (500 mg total) by mouth 4 (four) times daily. 12/26/17   Derwood Kaplan, MD  cetirizine (ZYRTEC) 10 MG tablet Take one daily for runny nose or drainage. Patient taking differently: Take 10 mg by mouth daily as needed for allergies. Take one daily for runny nose or drainage. 12/30/15   Bobbitt, Heywood Iles, MD  EPINEPHrine 0.3 mg/0.3 mL IJ SOAJ injection Inject 0.3 mg into the muscle as needed (allergic reaction).     [provider]  etonogestrel (NEXPLANON) 68 MG IMPL implant 1 each by Subdermal route once.    [provider]  ibuprofen (ADVIL,MOTRIN) 600 MG tablet Take 1 tablet (600 mg total) by mouth every 6 (six) hours as needed. 12/26/17  Derwood Kaplan, MD  megestrol (MEGACE) 40 MG tablet Take 2 daily Patient taking differently: Take 80 mg by mouth daily as needed (for bleeding).  07/01/17   Adline Potter, NP  methocarbamol (ROBAXIN) 500 MG tablet Take 1 tablet (500 mg total) by mouth 2 (two) times daily. 06/18/18   Hedges, Tinnie Gens, PA-C  ondansetron (ZOFRAN ODT) 8 MG disintegrating tablet Take 1 tablet (8 mg total) by mouth every 8 (eight) hours as needed for nausea. 12/26/17   Derwood Kaplan, MD  phenazopyridine (PYRIDIUM) 200 MG tablet Take 1 tablet (200 mg total) by mouth 3 (three) times daily. 12/23/17   Janne Napoleon, NP    Family History Family History  Problem Relation Age of Onset  . Stroke Father   . Stroke Maternal Grandmother   . Cancer Paternal  Grandmother   . Other Paternal Grandfather        disabled    Social History Social History   Tobacco Use  . Smoking status: Never Smoker  . Smokeless tobacco: Never Used  Substance Use Topics  . Alcohol use: No    Frequency: Never    Comment: occasional  . Drug use: No     Allergies   Latex and Percocet [oxycodone-acetaminophen]   Review of Systems Review of Systems  Respiratory: Positive for cough.   All other systems reviewed and are negative.    Physical Exam Updated Vital Signs BP 133/90 (BP Location: Right Arm)   Pulse 77   Temp 98.9 F (37.2 C) (Oral)   Resp 18   Ht 4\' 11"  (1.499 m)   Wt 54.4 kg   SpO2 100%   BMI 24.24 kg/m   Physical Exam  Constitutional: She is oriented to person, place, and time. She appears well-developed and well-nourished. No distress.  HENT:  Head: Atraumatic.  Mouth/Throat: Oropharynx is clear and moist.  Eyes: Conjunctivae are normal.  Neck: Neck supple. No thyromegaly present.  Cardiovascular: Normal rate and regular rhythm.  Pulmonary/Chest: Effort normal and breath sounds normal. No stridor. No respiratory distress. She has no wheezes. She has no rales.  Abdominal: Soft. Bowel sounds are normal. She exhibits no distension. There is no tenderness.  Musculoskeletal: She exhibits no edema.  Lymphadenopathy:    She has no cervical adenopathy.  Neurological: She is alert and oriented to person, place, and time.  Skin: No rash noted.  Psychiatric: She has a normal mood and affect.  Nursing note and vitals reviewed.    ED Treatments / Results  Labs (all labs ordered are listed, but only abnormal results are displayed) Labs Reviewed - No data to display  EKG None  Radiology Dg Chest 2 View  Result Date: 10/28/2018 CLINICAL DATA:  32 year old female with cough. EXAM: CHEST - 2 VIEW COMPARISON:  None. FINDINGS: The heart size and mediastinal contours are within normal limits. Both lungs are clear. The visualized  skeletal structures are unremarkable. IMPRESSION: No active cardiopulmonary disease. Electronically Signed   By: Elgie Collard M.D.   On: 10/28/2018 02:13    Procedures Procedures (including critical care time)  Medications Ordered in ED Medications  benzonatate (TESSALON) capsule 100 mg (has no administration in time range)     Initial Impression / Assessment and Plan / ED Course  I have reviewed the triage vital signs and the nursing notes.  Pertinent labs & imaging results that were available during my care of the patient were reviewed by me and considered in my medical decision making (see chart for  details).     BP 133/90 (BP Location: Right Arm)   Pulse 77   Temp 98.9 F (37.2 C) (Oral)   Resp 18   Ht 4\' 11"  (1.499 m)   Wt 54.4 kg   SpO2 100%   BMI 24.24 kg/m    Final Clinical Impressions(s) / ED Diagnoses   Final diagnoses:  Cough    ED Discharge Orders         Ordered    benzonatate (TESSALON) 100 MG capsule  Every 8 hours     10/28/18 0231    guaifenesin (ROBITUSSIN) 100 MG/5ML syrup  Every 4 hours PRN     10/28/18 0231         1:54 AM Patient here with persistent cough for nearly a month.  No hypoxia and she is not tachycardic.  She does not have any significant risk factor for PE.  There is no wheezing.  Will obtain chest x-ray to rule out pneumonia.  2:25 AM Chest x-ray unremarkable.  Patient's vital signs are stable, no fever, no tachycardia, no hypotension, no hypoxia.  Patient will be discharged home with symptomatic treatment and outpatient follow-up.   Fayrene Helperran, Jordy Hewins, PA-C 10/28/18 0232    Gilda CreasePollina, Christopher J, MD 10/29/18 [phone]

## 2018-11-07 ENCOUNTER — Emergency Department (HOSPITAL_COMMUNITY): Payer: Self-pay

## 2018-11-07 ENCOUNTER — Emergency Department (HOSPITAL_COMMUNITY)
Admission: EM | Admit: 2018-11-07 | Discharge: 2018-11-07 | Disposition: A | Discharge status: Home / Self Care | Payer: Self-pay | Attending: Emergency Medicine | Admitting: Emergency Medicine

## 2018-11-07 DIAGNOSIS — R059 Cough, unspecified: Secondary | ICD-10-CM

## 2018-11-07 DIAGNOSIS — Z79899 Other long term (current) drug therapy: Secondary | ICD-10-CM | POA: Insufficient documentation

## 2018-11-07 DIAGNOSIS — R05 Cough: Secondary | ICD-10-CM | POA: Insufficient documentation

## 2018-11-07 DIAGNOSIS — R51 Headache: Secondary | ICD-10-CM | POA: Insufficient documentation

## 2018-11-07 DIAGNOSIS — R519 Headache, unspecified: Secondary | ICD-10-CM

## 2018-11-07 MED ORDER — SODIUM CHLORIDE 0.9 % IV BOLUS
1000.0000 mL | Freq: Once | INTRAVENOUS | Status: AC
  Administered 2018-11-07: 1000 mL via INTRAVENOUS

## 2018-11-07 MED ORDER — KETOROLAC TROMETHAMINE 30 MG/ML IJ SOLN
30.0000 mg | Freq: Once | INTRAMUSCULAR | Status: AC
  Administered 2018-11-07: 30 mg via INTRAMUSCULAR
  Filled 2018-11-07: qty 1

## 2018-11-07 MED ORDER — DIPHENHYDRAMINE HCL 50 MG/ML IJ SOLN
25.0000 mg | Freq: Once | INTRAMUSCULAR | Status: DC
  Filled 2018-11-07: qty 1

## 2018-11-07 MED ORDER — OMEPRAZOLE 20 MG PO CPDR: 20.0000 mg | DELAYED_RELEASE_CAPSULE | Freq: Every day | ORAL | 0 refills | Status: DC

## 2018-11-07 MED ORDER — METOCLOPRAMIDE HCL 5 MG/ML IJ SOLN
10.0000 mg | Freq: Once | INTRAMUSCULAR | Status: AC
  Administered 2018-11-07: 10 mg via INTRAVENOUS
  Filled 2018-11-07: qty 2

## 2018-11-07 NOTE — ED Notes (Signed)
The pt does not want the benadryl

## 2018-11-07 NOTE — ED Provider Notes (Signed)
[facility] Provider Note   CSN: [id number] Arrival date & time: 11/07/18  1549     History   Chief Complaint Chief Complaint  Patient presents with  . Migraine  . Cough    HPI Sylvia Day is a 32 y.o. female.  She is complained of a left frontal and retro-orbital headache that is been going on for a few weeks.  She is also had a cough mostly from irritation in her throat that is been constant.  She was seen here last week and had a chest x-ray that did not show any obvious infiltrate.  She was treated with Jerilynn Somessalon Perles which helped a little.  She is having difficulty sleeping secondary to this cough and headache.  She has a little bit of photophobia.  No fevers or chills no runny nose or sore throat.  Cough is been nonproductive.  Her throat feels dry.  She has been taking Tylenol and ibuprofen without any relief.  The history is provided by the patient.  Migraine  This is a new problem. The current episode started more than 1 week ago. The problem occurs constantly. The problem has not changed since onset.Associated symptoms include headaches. Pertinent negatives include no chest pain, no abdominal pain and no shortness of breath. Exacerbated by: bright lights. The symptoms are relieved by acetaminophen and NSAIDs. She has tried acetaminophen for the symptoms. The treatment provided mild relief.  Cough  This is a new problem. The current episode started more than 1 week ago. The problem occurs every few minutes. The problem has not changed since onset.The cough is non-productive. There has been no fever. Associated symptoms include headaches. Pertinent negatives include no chest pain, no rhinorrhea, no sore throat and no shortness of breath. She has tried cough syrup for the symptoms. The treatment provided mild relief. She is not a smoker.    Past Medical History:  Diagnosis Date  . Hives   . Medical history non-contributory      Patient Active Problem List   Diagnosis Date Noted  . Hair loss 07/01/2017  . Nexplanon in place 07/01/2017  . Spotting 07/01/2017  . Irregular intermenstrual bleeding 07/01/2017  . Well woman exam with routine gynecological exam 03/21/2017  . S/P primary low transverse C-section 01/30/2017  . Vulvovaginitis due to yeast 10/25/2016  . Hx of preeclampsia, prior pregnancy, currently pregnant 06/25/2016  . H/O maternal fourth degree perineal laceration, currently pregnant 06/25/2016  . History of postpartum hemorrhage, currently pregnant 06/25/2016    Past Surgical History:  Procedure Laterality Date  . CESAREAN SECTION N/A 01/30/2017   Procedure: PRIMARY CESAREAN SECTION;  Surgeon: Lazaro ArmsLuther H Eure, MD;  [facility];  Service: Obstetrics;  Laterality: N/A;  . NO PAST SURGERIES       OB History    Gravida  2   Para  2   Term  2   Preterm      AB      Living  2     SAB      TAB      Ectopic      Multiple  0   Live Births  2            Home Medications    Prior to Admission medications   Medication Sig Start Date End Date Taking? Authorizing Provider  benzonatate (TESSALON) 100 MG capsule Take 1 capsule (100 mg total) by mouth every 8 (eight) hours. 10/28/18   Laveda Normanran,  Greta Doom, PA-C  Biotin (BIOTIN 5000) 5 MG CAPS Take 1 capsule by mouth daily.     [provider]  cephALEXin (KEFLEX) 500 MG capsule Take 1 capsule (500 mg total) by mouth 4 (four) times daily. 12/23/17   Janne Napoleon, NP  cephALEXin (KEFLEX) 500 MG capsule Take 1 capsule (500 mg total) by mouth 4 (four) times daily. 12/26/17   Derwood Kaplan, MD  cetirizine (ZYRTEC) 10 MG tablet Take one daily for runny nose or drainage. Patient taking differently: Take 10 mg by mouth daily as needed for allergies. Take one daily for runny nose or drainage. 12/30/15   Bobbitt, Heywood Iles, MD  EPINEPHrine 0.3 mg/0.3 mL IJ SOAJ injection Inject 0.3 mg into the muscle as needed (allergic  reaction).     [provider]  etonogestrel (NEXPLANON) 68 MG IMPL implant 1 each by Subdermal route once.    [provider]  guaifenesin (ROBITUSSIN) 100 MG/5ML syrup Take 5-10 mLs (100-200 mg total) by mouth every 4 (four) hours as needed for cough. 10/28/18   Fayrene Helper, PA-C  ibuprofen (ADVIL,MOTRIN) 600 MG tablet Take 1 tablet (600 mg total) by mouth every 6 (six) hours as needed. 12/26/17   Derwood Kaplan, MD  megestrol (MEGACE) 40 MG tablet Take 2 daily Patient taking differently: Take 80 mg by mouth daily as needed (for bleeding).  07/01/17   Adline Potter, NP  methocarbamol (ROBAXIN) 500 MG tablet Take 1 tablet (500 mg total) by mouth 2 (two) times daily. 06/18/18   Hedges, Tinnie Gens, PA-C  ondansetron (ZOFRAN ODT) 8 MG disintegrating tablet Take 1 tablet (8 mg total) by mouth every 8 (eight) hours as needed for nausea. 12/26/17   Derwood Kaplan, MD  phenazopyridine (PYRIDIUM) 200 MG tablet Take 1 tablet (200 mg total) by mouth 3 (three) times daily. 12/23/17   Janne Napoleon, NP    Family History Family History  Problem Relation Age of Onset  . Stroke Father   . Stroke Maternal Grandmother   . Cancer Paternal Grandmother   . Other Paternal Grandfather        disabled    Social History Social History   Tobacco Use  . Smoking status: Never Smoker  . Smokeless tobacco: Never Used  Substance Use Topics  . Alcohol use: No    Frequency: Never    Comment: occasional  . Drug use: No     Allergies   Latex and Percocet [oxycodone-acetaminophen]   Review of Systems Review of Systems  Constitutional: Negative for fever.  HENT: Negative for rhinorrhea and sore throat.   Eyes: Positive for photophobia. Negative for visual disturbance.  Respiratory: Positive for cough. Negative for shortness of breath.   Cardiovascular: Negative for chest pain.  Gastrointestinal: Negative for abdominal pain, nausea and vomiting.  Genitourinary: Negative for dysuria.   Musculoskeletal: Negative for neck pain.  Skin: Negative for rash.  Neurological: Positive for headaches. Negative for syncope.     Physical Exam Updated Vital Signs BP 138/89 (BP Location: Right Arm)   Pulse 77   Temp 98.2 F (36.8 C) (Oral)   Resp 18   Ht 4\' 11"  (1.499 m)   Wt 52.2 kg   SpO2 100%   BMI 23.23 kg/m   Physical Exam Vitals signs and nursing note reviewed.  Constitutional:      General: She is not in acute distress.    Appearance: She is well-developed.  HENT:     Head: Normocephalic and atraumatic.  Right Ear: Tympanic membrane and ear canal normal.     Left Ear: Tympanic membrane and ear canal normal.     Mouth/Throat:     Mouth: Mucous membranes are moist.     Pharynx: Oropharynx is clear.  Eyes:     Extraocular Movements: Extraocular movements intact.     Conjunctiva/sclera: Conjunctivae normal.     Pupils: Pupils are equal, round, and reactive to light.  Neck:     Musculoskeletal: Normal range of motion and neck supple.  Cardiovascular:     Rate and Rhythm: Normal rate and regular rhythm.     Heart sounds: No murmur.  Pulmonary:     Effort: Pulmonary effort is normal. No respiratory distress.     Breath sounds: Normal breath sounds.  Abdominal:     Palpations: Abdomen is soft.     Tenderness: There is no abdominal tenderness.  Musculoskeletal: Normal range of motion.  Skin:    General: Skin is warm and dry.     Capillary Refill: Capillary refill takes less than 2 seconds.  Neurological:     General: No focal deficit present.     Mental Status: She is alert.      ED Treatments / Results  Labs (all labs ordered are listed, but only abnormal results are displayed) Labs Reviewed - No data to display  EKG None  Radiology Ct Head Wo Contrast  Result Date: 11/07/2018 CLINICAL DATA:  Initial evaluation for acute migraine for 2 weeks. Headache. EXAM: CT HEAD WITHOUT CONTRAST TECHNIQUE: Contiguous axial images were obtained from the  base of the skull through the vertex without intravenous contrast. COMPARISON:  Prior CT from 06/18/2018. FINDINGS: Brain: Cerebral volume within normal limits for patient age. No evidence for acute intracranial hemorrhage. No findings to suggest acute large vessel territory infarct. No mass lesion, midline shift, or mass effect. Ventricles are normal in size without evidence for hydrocephalus. No extra-axial fluid collection identified. Vascular: No hyperdense vessel identified. Skull: Scalp soft tissues demonstrate no acute abnormality. Calvarium intact. Sinuses/Orbits: Globes and orbital soft tissues within normal limits. Visualized paranasal sinuses are clear. No mastoid effusion. IMPRESSION: Normal head CT.  No acute intracranial abnormality identified. Electronically Signed   By: Rise Mu M.D.   On: 11/07/2018 18:59    Procedures Procedures (including critical care time)  Medications Ordered in ED Medications  metoCLOPramide (REGLAN) injection 10 mg (has no administration in time range)  ketorolac (TORADOL) 30 MG/ML injection 30 mg (has no administration in time range)  sodium chloride 0.9 % bolus 1,000 mL (has no administration in time range)     Initial Impression / Assessment and Plan / ED Course  I have reviewed the triage vital signs and the nursing notes.  Pertinent labs & imaging results that were available during my care of the patient were reviewed by me and considered in my medical decision making (see chart for details).  Clinical Course as of Nov 08 826  Fri Nov 07, 2018  1756 Reevaluated the patient after the medications.  She says her headache is much improved but she is feeling a little tired but also a little anxious.  She probably has having a little akathisia so far have offered her and she is willing to take some Benadryl.   [MB]    Clinical Course User Index [MB] Terrilee Files, MD    Final Clinical Impressions(s) / ED Diagnoses   Final  diagnoses:  Acute nonintractable headache, unspecified headache type  Cough  ED Discharge Orders         Ordered    omeprazole (PRILOSEC) 20 MG capsule  Daily     11/07/18 1804           Terrilee Files, MD 11/08/18 [id number]

## 2018-11-07 NOTE — Discharge Instructions (Addendum)
You are seen in the emergency department for a headache and a cough.  You had a CAT scan of your head.  Your headache improved with some medication here.  As far as her cough goes this may be related to some reflux and so we are prescribing you with some acid medication to try.  It will be important for you to follow-up with your primary care doctor.  Stay well-hydrated.

## 2018-11-07 NOTE — ED Triage Notes (Signed)
Pt. Reports having a cough and a migraine for approximately 1 week.  She was seen for her cough last week and then in the last few days she has developed a migraine.  She is alert and oriented X4.  Skin is p/w/d

## 2018-11-07 NOTE — ED Notes (Signed)
Pt in ct 

## 2018-11-07 NOTE — ED Notes (Signed)
Cough for 2 months  Also has a headache for 2-3 days  Coughing non-oproductive

## 2019-03-16 ENCOUNTER — Telehealth: Payer: Self-pay | Admitting: Adult Health

## 2019-03-16 NOTE — Telephone Encounter (Signed)
Spoke with pt. Pt is feeling overwhelmed with everything going on. Having anxiety attacks. Pt does not feel suicidal and don't feel like harming anyone else. Attempted to transfer call to front desk but advised to call back and schedule a webex appt if we got disconnected. Pt voiced understanding. JSY

## 2019-03-16 NOTE — Telephone Encounter (Signed)
Pt is wanting an appt due to anxiety and panic attacks. She states that she has been laid off due to the coronavirus and has had some extra stress.

## 2019-03-18 ENCOUNTER — Other Ambulatory Visit: Payer: Self-pay

## 2019-03-18 ENCOUNTER — Encounter: Payer: Self-pay | Admitting: Adult Health

## 2019-03-18 ENCOUNTER — Ambulatory Visit (INDEPENDENT_AMBULATORY_CARE_PROVIDER_SITE_OTHER): Payer: Medicaid Other | Admitting: Adult Health

## 2019-03-18 DIAGNOSIS — F419 Anxiety disorder, unspecified: Secondary | ICD-10-CM

## 2019-03-18 MED ORDER — BUSPIRONE HCL 5 MG PO TABS: 5.0000 mg | ORAL_TABLET | Freq: Three times a day (TID) | ORAL | 1 refills | Status: DC

## 2019-03-18 MED ORDER — HYDROXYZINE HCL 10 MG PO TABS: 10.0000 mg | ORAL_TABLET | Freq: Three times a day (TID) | ORAL | 3 refills | Status: DC | PRN

## 2019-03-18 NOTE — Progress Notes (Signed)
Patient ID: Sylvia Day, female   DOB: [birth year], 33 y.o.   MRN: [id number]   TELEHEALTH VIRTUAL GYNECOLOGY VISIT ENCOUNTER NOTE  I connected with Sylvia Day on 03/18/19 at  2:15 PM EDT by telephone at home and verified that I am speaking with the correct person using two identifiers.   I discussed the limitations, risks, security and privacy concerns of performing an evaluation and management service by telephone and the availability of in person appointments. I also discussed with the patient that there may be a patient responsible charge related to this service. The patient expressed understanding and agreed to proceed.   History:  Sylvia Day is a 33 y.o. [id number] female being evaluated today for ?anxiety, having trouble sleeping and feels anxious, and can't breath, feels like lump in throat at times, just overwhelmed, and worries. She was laid off from work 02/19/2019 and is home with 34 year old and 34 year old.  She denies any depression or other concerns.    Will try Buspar and vistaril and try to relax, breaths in brown paper bag, take dep breaths.  Discussed this is a reactive anxiety, and meds should help, and should resolve some when things get back to normal.    Past Medical History:  Diagnosis Date  . Hives   . Medical history non-contributory    Past Surgical History:  Procedure Laterality Date  . CESAREAN SECTION N/A 01/30/2017   Procedure: PRIMARY CESAREAN SECTION;  Surgeon: Lazaro Arms, MD;  Location: [facility] BIRTHING SUITES;  Service: Obstetrics;  Laterality: N/A;  . NO PAST SURGERIES     The following portions of the patient's history were reviewed and updated as appropriate: allergies, current medications, past family history, past medical history, past social history, past surgical history and problem list.   Health Maintenance:  Normal pap and negative HRHPV on 07/01/17.  Review of Systems:  Pertinent items noted in HPI and remainder of comprehensive ROS  otherwise negative.  Physical Exam:   General:  Alert, oriented and cooperative.   Mental Status: Normal mood and affect perceived. Normal judgment and thought content.  Physical exam deferred due to nature of the encounter PHQ 2 score 0.   Labs and Imaging No results found for this or any previous visit (from the past 336 hour(s)). No results found.    Assessment and Plan:     1. Anxiety Meds ordered this encounter  Medications  . busPIRone (BUSPAR) 5 MG tablet    Sig: Take 1 tablet (5 mg total) by mouth 3 (three) times daily.    Dispense:  90 tablet    Refill:  1    Order Specific Question:   Supervising Provider    Answer:   Despina Hidden, LUTHER H [[id number]]  . hydrOXYzine (ATARAX/VISTARIL) 10 MG tablet    Sig: Take 1 tablet (10 mg total) by mouth 3 (three) times daily as needed.    Dispense:  30 tablet    Refill:  3    Order Specific Question:   Supervising Provider    Answer:   Lazaro Arms [[id number]]  Will F/U in 4 weeks,  call me if needed Try to get outside and relax       I discussed the assessment and treatment plan with the patient. The patient was provided an opportunity to ask questions and all were answered. The patient agreed with the plan and demonstrated an understanding of the instructions.   The patient was advised to call back or seek  an in-person evaluation/go to the ED if the symptoms worsen or if the condition fails to improve as anticipated.  I provided 8 minutes of face-to-face time during this encounter.   Cyril MourningJennifer Mekel Haverstock, NP Center for [facility], [facility]

## 2019-04-09 ENCOUNTER — Encounter: Payer: Self-pay | Admitting: *Deleted

## 2019-04-10 ENCOUNTER — Other Ambulatory Visit: Payer: Self-pay

## 2019-04-10 ENCOUNTER — Ambulatory Visit (INDEPENDENT_AMBULATORY_CARE_PROVIDER_SITE_OTHER): Payer: Medicaid Other | Admitting: Adult Health

## 2019-04-10 ENCOUNTER — Encounter: Payer: Self-pay | Admitting: Adult Health

## 2019-04-10 VITALS — Ht 59.0 in

## 2019-04-10 DIAGNOSIS — F419 Anxiety disorder, unspecified: Secondary | ICD-10-CM | POA: Diagnosis not present

## 2019-04-10 NOTE — Progress Notes (Addendum)
Patient ID: Sylvia Day, female   DOB: 1986-11-23, 33 y.o.   MRN: [id number]   TELEHEALTH VIRTUAL GYNECOLOGY VISIT ENCOUNTER NOTE  I connected with Sylvia Day on 04/10/19 at 11:00 AM EDT by telephone at home and verified that I am speaking with the correct person using two identifiers.   I discussed the limitations, risks, security and privacy concerns of performing an evaluation and management service by telephone and the availability of in person appointments. I also discussed with the patient that there may be a patient responsible charge related to this service. The patient expressed understanding and agreed to proceed.   History:  Sylvia Day is a 33 y.o. G64P2002 hispanic female being evaluated today for anxiety, she is taking Buspar, just once daily, it made her too relaxed to take tid and taking  Vistaril at bedtime, it helps her sleep . She denies any depression and anxiety is better, or other concerns.       Past Medical History:  Diagnosis Date  . Hives   . Medical history non-contributory    Past Surgical History:  Procedure Laterality Date  . CESAREAN SECTION N/A 01/30/2017   Procedure: PRIMARY CESAREAN SECTION;  Surgeon: Lazaro Arms, MD;  Location: [facility] BIRTHING SUITES;  Service: Obstetrics;  Laterality: N/A;  . NO PAST SURGERIES     The following portions of the patient's history were reviewed and updated as appropriate: allergies, current medications, past family history, past medical history, past social history, past surgical history and problem list.   Health Maintenance:  Normal pap and negative HRHPV on 07/01/17.  Review of Systems:  Pertinent items noted in HPI and remainder of comprehensive ROS otherwise negative.   Physical Exam:   General:  Alert, oriented and cooperative.   Mental Status: Normal mood and affect perceived. Normal judgment and thought content.  Physical exam deferred due to nature of the encounter Ht 4\' 11"  (1.499 m)   BMI 23.23  kg/m per pt.Fall risk is low. PHQ 2 score 0.  Will continue Buspar once daily and vistaril at hs and prn.   Labs and Imaging No results found for this or any previous visit (from the past 336 hour(s)). No results found.    Assessment and Plan:     1. Anxiety -can take Buspar 5 mg once daily and vistaril at bedtime and prn,has refills on both Follow up in 8 weeks or sooner if needed        I discussed the assessment and treatment plan with the patient. The patient was provided an opportunity to ask questions and all were answered. The patient agreed with the plan and demonstrated an understanding of the instructions.   The patient was advised to call back or seek an in-person evaluation/go to the ED if the symptoms worsen or if the condition fails to improve as anticipated.  I provided 5 minutes of non-face-to-face time during this encounter.   Cyril Mourning, NP Center for [employer], [facility]

## 2019-05-09 DIAGNOSIS — H5213 Myopia, bilateral: Secondary | ICD-10-CM | POA: Diagnosis not present

## 2019-05-25 DIAGNOSIS — H5213 Myopia, bilateral: Secondary | ICD-10-CM | POA: Diagnosis not present

## 2019-05-27 DIAGNOSIS — H16142 Punctate keratitis, left eye: Secondary | ICD-10-CM | POA: Diagnosis not present

## 2019-06-18 DIAGNOSIS — H5213 Myopia, bilateral: Secondary | ICD-10-CM | POA: Diagnosis not present

## 2019-07-15 ENCOUNTER — Telehealth: Payer: Self-pay | Admitting: Adult Health

## 2019-07-15 MED ORDER — MEGESTROL ACETATE 40 MG PO TABS: ORAL_TABLET | ORAL | 1 refills | Status: DC

## 2019-07-15 NOTE — Telephone Encounter (Signed)
Got Nexplanon in April 2018. Pt had bleeding and was prescribed Megace. Ran out of Megace last week. Pt having to wear panty liners often and is having irritation. Can you order more Megace? Thanks!! [place]

## 2019-07-15 NOTE — Telephone Encounter (Signed)
Pt states that she is spotting. She states she has the nexplanon. Pt also states that she is unable to use tampons and is wearing a pad but it is causing a rash. Please advise by phone. Pt request to not get a message in her mychart.

## 2019-07-15 NOTE — Telephone Encounter (Signed)
Left message @ 11:53 am. JSY 

## 2019-07-15 NOTE — Addendum Note (Signed)
Addended by: Roma Schanz on: 07/15/2019 05:01 PM   Modules accepted: Orders

## 2019-12-21 ENCOUNTER — Ambulatory Visit (INDEPENDENT_AMBULATORY_CARE_PROVIDER_SITE_OTHER): Payer: 59 | Admitting: Advanced Practice Midwife

## 2019-12-21 ENCOUNTER — Other Ambulatory Visit (HOSPITAL_COMMUNITY): Payer: Self-pay | Admitting: Advanced Practice Midwife

## 2019-12-21 ENCOUNTER — Encounter: Payer: Self-pay | Admitting: Advanced Practice Midwife

## 2019-12-21 ENCOUNTER — Other Ambulatory Visit: Payer: Self-pay

## 2019-12-21 VITALS — BP 107/81 | HR 69 | Ht 59.0 in | Wt 121.0 lb

## 2019-12-21 DIAGNOSIS — S3141XA Laceration without foreign body of vagina and vulva, initial encounter: Secondary | ICD-10-CM

## 2019-12-21 MED ORDER — LIDOCAINE 5 % EX CREA: 1.0000 "application " | TOPICAL_CREAM | CUTANEOUS | 1 refills | Status: DC | PRN

## 2019-12-21 NOTE — Progress Notes (Signed)
[facility] Clinic Visit  Patient name: Sylvia Day MRN [id number]  Date of birth: 11-04-1986  CC & HPI:  Sylvia Day is a 34 y.o. Hispanic female presenting today for irritation after waxing.  Went to the salon on Saturday and attempted a [place] wax. Called it off after one or two swipes d/t pain.  Has been having pain ever since, esp when voiding.  Has tried Desitin and vaseline, feels like they made it worse.    Medical & Surgical Hx:   Past Medical History:  Diagnosis Date  . Hives   . Medical history non-contributory    Past Surgical History:  Procedure Laterality Date  . CESAREAN SECTION N/A 01/30/2017   Procedure: PRIMARY CESAREAN SECTION;  Surgeon: Lazaro Arms, MD;  Location: [facility] BIRTHING SUITES;  Service: Obstetrics;  Laterality: N/A;  . NO PAST SURGERIES     Family History  Problem Relation Age of Onset  . Stroke Father   . Stroke Maternal Grandmother   . Cancer Paternal Grandmother   . Other Paternal Grandfather        disabled    Current Outpatient Medications:  .  Biotin (BIOTIN 5000) 5 MG CAPS, Take 1 capsule by mouth daily. , Disp: , Rfl:  .  busPIRone (BUSPAR) 5 MG tablet, Take 1 tablet (5 mg total) by mouth 3 (three) times daily., Disp: 90 tablet, Rfl: 1 .  cetirizine (ZYRTEC) 10 MG tablet, Take one daily for runny nose or drainage. (Patient taking differently: Take 10 mg by mouth daily as needed for allergies. Take one daily for runny nose or drainage.), Disp: 30 tablet, Rfl: 0 .  etonogestrel (NEXPLANON) 68 MG IMPL implant, 1 each by Subdermal route once., Disp: , Rfl:  .  EPINEPHrine 0.3 mg/0.3 mL IJ SOAJ injection, Inject 0.3 mg into the muscle as needed (allergic reaction). , Disp: , Rfl:  .  hydrOXYzine (ATARAX/VISTARIL) 10 MG tablet, Take 1 tablet (10 mg total) by mouth 3 (three) times daily as needed. (Patient not taking: Reported on 12/21/2019), Disp: 30 tablet, Rfl: 3 .  Lidocaine 5 % CREA, Apply 1 application topically every 2 (two)  hours as needed., Disp: 30 g, Rfl: 1 .  megestrol (MEGACE) 40 MG tablet, Take 2 daily as needed for bleeding (Patient not taking: Reported on 12/21/2019), Disp: 60 tablet, Rfl: 1 Social History: Reviewed -  reports that she has never smoked. She has never used smokeless tobacco.  Review of Systems:   Constitutional: Negative for fever and chills Eyes: Negative for visual disturbances Respiratory: Negative for shortness of breath, dyspnea Cardiovascular: Negative for chest pain or palpitations  Gastrointestinal: Negative for vomiting, diarrhea and constipation; no abdominal pain Genitourinary: Negative for dysuria and urgency, vaginal irritation or itching Musculoskeletal: Negative for back pain, joint pain, myalgias  Neurological: Negative for dizziness and headaches    Objective Findings:    Physical Examination: Vitals:   12/21/19 1528  BP: 107/81  Pulse: 69   General appearance - well appearing, and in no distress Mental status - alert, oriented to person, place, and time Chest:  Normal respiratory effort Heart - normal rate and regular rhythm Abdomen:  Soft, nontender Pelvic: small bleeding ulcerated area at introitus.  No folliculitis or rash, no edema. HSV cx taken d/t appearance, but doubt it will be +.  Musculoskeletal:  Normal range of motion without pain Extremities:  No edema    No results found for this or any previous visit (from the past 24 hour(s)).  Assessment & Plan:  A:   Vaginal lac s/p waxing P:  Lidocaine cream, try coconut oil.  No intercourse until healed.     No follow-ups on file.  Christin Fudge CNM 12/21/2019 3:56 PM

## 2019-12-22 ENCOUNTER — Telehealth: Payer: Self-pay | Admitting: *Deleted

## 2019-12-22 NOTE — Telephone Encounter (Signed)
Pt got waxing done and was seen yesterday for that. She thinks she has a UTI. Please call. Thanks!!

## 2019-12-22 NOTE — Telephone Encounter (Signed)
Left message for pt to call and schedule a nurse visit so we can check her urine. JSY

## 2019-12-23 ENCOUNTER — Other Ambulatory Visit (INDEPENDENT_AMBULATORY_CARE_PROVIDER_SITE_OTHER): Payer: 59

## 2019-12-23 ENCOUNTER — Other Ambulatory Visit: Payer: Self-pay

## 2019-12-23 DIAGNOSIS — R300 Dysuria: Secondary | ICD-10-CM

## 2019-12-23 LAB — POCT URINALYSIS DIPSTICK OB
Blood, UA: 3
Glucose, UA: NEGATIVE
Ketones, UA: NEGATIVE
Leukocytes, UA: NEGATIVE
Nitrite, UA: NEGATIVE

## 2019-12-23 LAB — HERPES SIMPLEX VIRUS CULTURE

## 2019-12-23 NOTE — Progress Notes (Signed)
   NURSE VISIT- UTI SYMPTOMS   SUBJECTIVE:  Sylvia Day is a 34 y.o. G68P2002 female here for UTI symptoms. She is a GYN patient. She reports dysuria.Taking AZO.urine showed 3+ blood and 1+ protein  OBJECTIVE:  There were no vitals taken for this visit.  Appears well, in no apparent distress  No results found for this or any previous visit (from the past 24 hour(s)).  ASSESSMENT: GYN patient with UTI symptoms and negative nitrites  PLAN: Note routed to [facility], AGNP  :  Urine culture sent Call or return to clinic prn if these symptoms worsen or fail to improve as anticipated. Follow-up: as needed   Rennis Petty  12/23/2019 9:14 AM

## 2019-12-23 NOTE — Progress Notes (Signed)
Chart reviewed for nurse visit. Agree with plan of care.  Adline Potter, NP 12/23/2019 9:52 AM

## 2019-12-25 LAB — URINE CULTURE

## 2020-03-16 ENCOUNTER — Ambulatory Visit (INDEPENDENT_AMBULATORY_CARE_PROVIDER_SITE_OTHER): Payer: 59 | Admitting: Advanced Practice Midwife

## 2020-03-16 ENCOUNTER — Other Ambulatory Visit: Payer: Self-pay

## 2020-03-16 ENCOUNTER — Encounter: Payer: Self-pay | Admitting: Advanced Practice Midwife

## 2020-03-16 VITALS — BP 112/76 | HR 73 | Ht 59.0 in | Wt 122.0 lb

## 2020-03-16 DIAGNOSIS — Z3046 Encounter for surveillance of implantable subdermal contraceptive: Secondary | ICD-10-CM | POA: Diagnosis not present

## 2020-03-16 DIAGNOSIS — Z975 Presence of (intrauterine) contraceptive device: Secondary | ICD-10-CM

## 2020-03-16 MED ORDER — NORETHIN ACE-ETH ESTRAD-FE 1-20 MG-MCG PO TABS: 1.0000 | ORAL_TABLET | Freq: Every day | ORAL | 4 refills | Status: DC

## 2020-03-16 NOTE — Progress Notes (Signed)
   NEXPLANON REMOVAL Patient name: Sylvia Day MRN [id number]  Date of birth: 1986-08-21 Subjective Findings:   Sylvia Day is a 34 y.o. G66P2002 Hispanic female being seen today for removal of a Nexplanon. Her Nexplanon was placed 03/21/2017.  She desires removal because she would like a break from the hormones; isn't trying to conceive; has occasional spotting but has otherwise been happy with the Nexplanon. Signed copy of informed consent in chart.   No LMP recorded. Patient has had an implant. Last pap August 2018. Results were:  normal The planned method of family planning is Nexplanon, but she would like to change to OCPs. Depression screen [facility] 2/9 04/10/2019 03/18/2019 07/01/2017  Decreased Interest 0 0 0  Down, Depressed, Hopeless 0 0 0  PHQ - 2 Score 0 0 0    Pertinent History Reviewed:   Reviewed past medical,surgical, social, obstetrical and family history.  Reviewed problem list, medications and allergies. Objective Findings & Procedure:    Vitals:   03/16/20 1112  BP: 112/76  Pulse: 73  Weight: 122 lb (55.3 kg)  Height: 4\' 11"  (1.499 m)  Body mass index is 24.64 kg/m.  No results found for this or any previous visit (from the past 24 hour(s)).   Time out was performed.  Nexplanon site identified.  Area prepped in usual sterile fashon. One cc of 2% lidocaine was used to anesthetize the area at the distal end of the implant. A small stab incision was made right beside the implant on the distal portion.  The Nexplanon rod was grasped using hemostats and removed without difficulty.  There was less than 3 cc blood loss. There were no complications.  Steri-strips were applied over the small incision and a pressure bandage was applied.  The patient tolerated the procedure well. Assessment & Plan:   1) Nexplanon removal She was instructed to keep the area clean and dry, remove pressure bandage in 24 hours, and keep insertion site covered with the steri-strip for 3-5 days.      2) New start OCPs, instructions given; if starts tomorrow will not need a backup method  F/U in August for Pap/Physical.  No orders of the defined types were placed in this encounter.   Follow-up: Return in about 3 months (around 06/15/2020) for in person; contraception f/u.  06/17/2020 CNM 03/16/2020 11:57 AM

## 2020-04-27 ENCOUNTER — Telehealth: Payer: Self-pay | Admitting: Women's Health

## 2020-04-27 ENCOUNTER — Telehealth: Payer: Self-pay | Admitting: *Deleted

## 2020-04-27 NOTE — Telephone Encounter (Signed)
Patient states she is having spotting now while on the pills. Wanted to know if she can take the Megace for this.  Informed patient that was fine to take but to know that it can take 2-3 packs of pills to get her cycle regulated.  Pt verbalized understanding.

## 2020-04-27 NOTE — Telephone Encounter (Signed)
Patient called stating that she had her IUD removed and was placed on pills, pt states that she has been spotting with the pills. Pt states that she has medication that was prescribed when she had break through bleeding with the IUD and she would like to know if she could take those pills. Please contact pt

## 2020-05-10 IMAGING — CT CT HEAD W/O CM
4 series · 17 of 47 positions shown, 19 images · non-contrast
Comparison: Head CT dated 05/11/2011.

CLINICAL DATA: ma acute headache. Headache for the last 3 days,
worsening.

EXAM:
CT HEAD WITHOUT CONTRAST
TECHNIQUE: Contiguous axial images were obtained from the base of the skull
through the vertex without intravenous contrast.

[Series 3: head wo · axial · 0.41mm/px · z∈[-108,+12]mm · 7 of 33 slices shown, 9 images]
[im 5/33  brain]
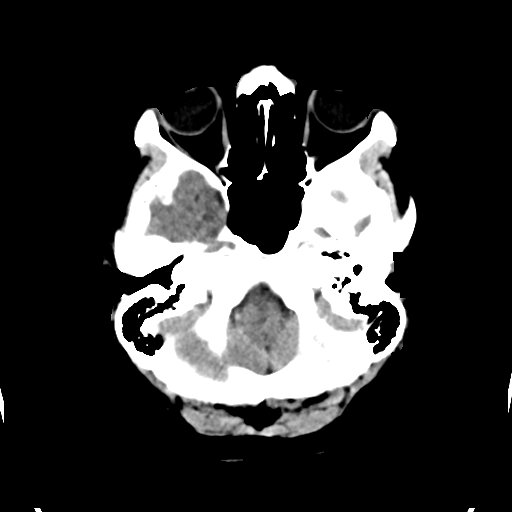
[im 5/33  bone]
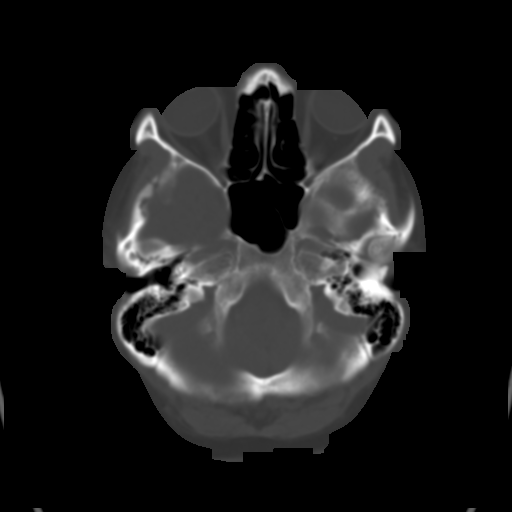
[im 9/33  brain]
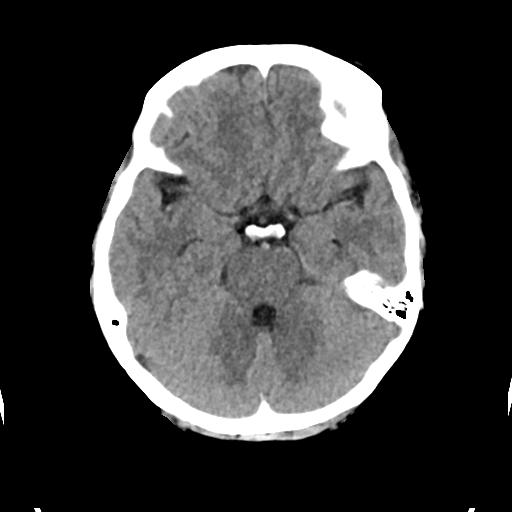
[im 13/33  brain]
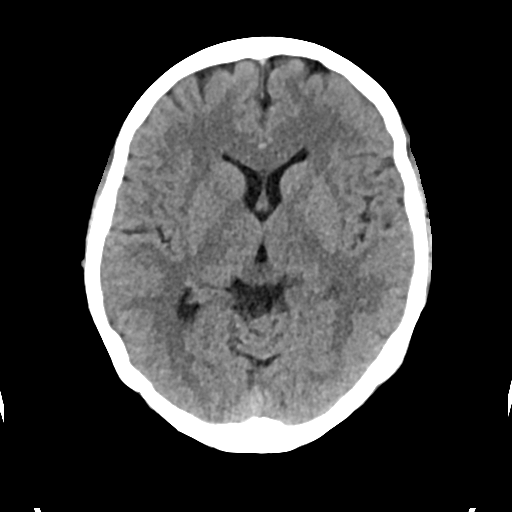
[im 17/33  brain]
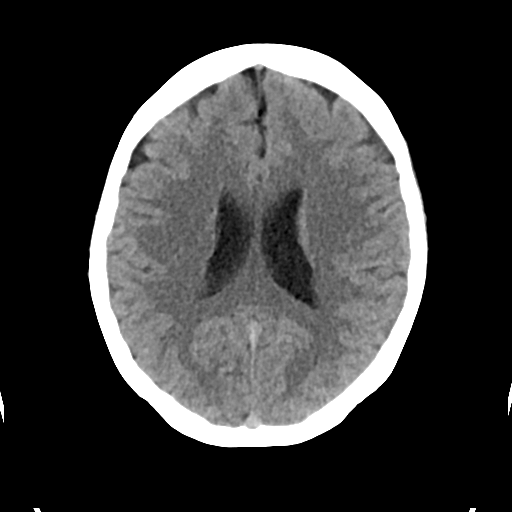
[im 21/33  brain]
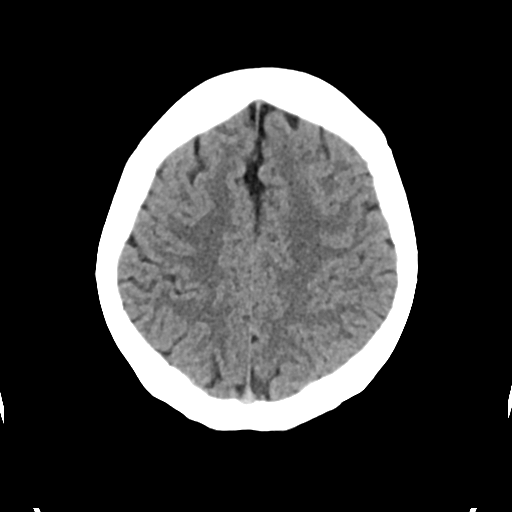
[im 21/33  bone]
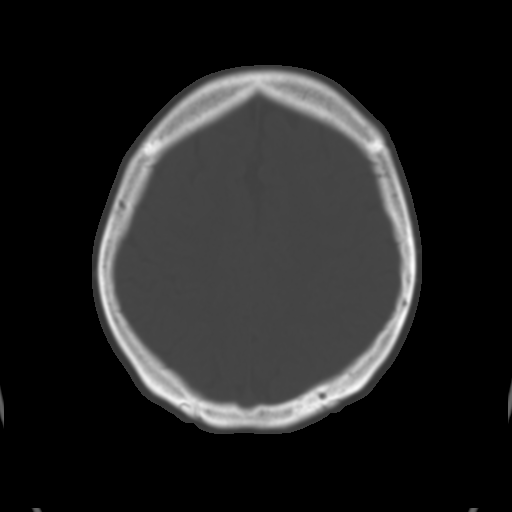
[im 25/33  brain]
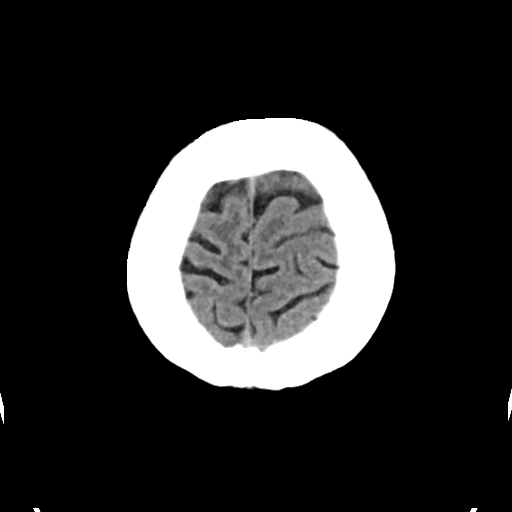
[im 29/33  brain]
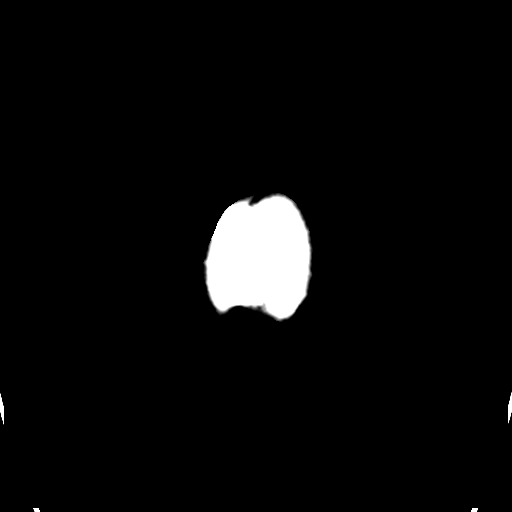

[Series 4: head bone · axial · 0.41mm/px · z∈[-112,-56]mm · 4 of 81 slices shown]
[im 9/81  bone]
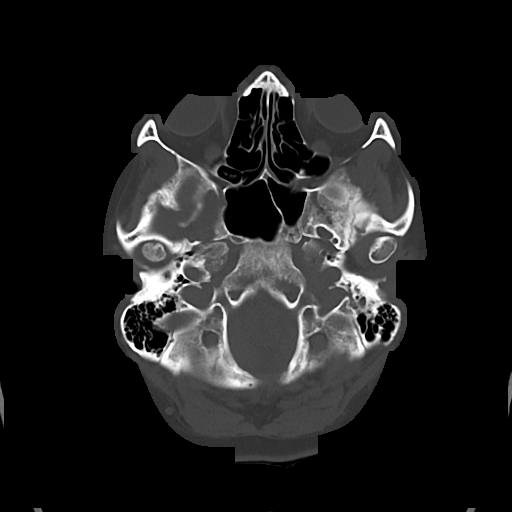
[im 17/81  bone]
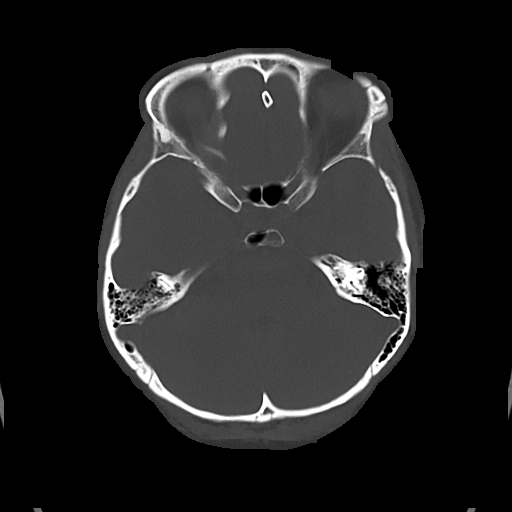
[im 25/81  bone]
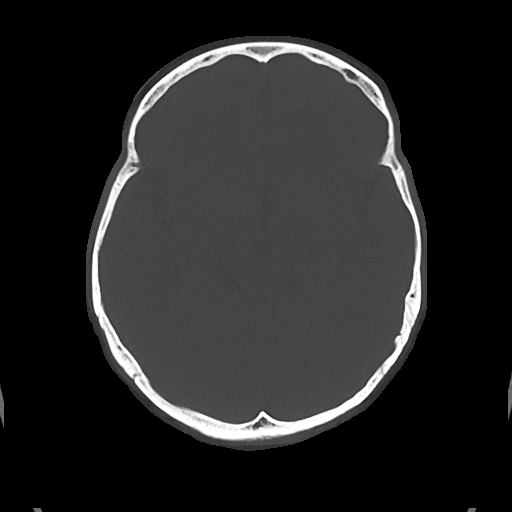
[im 37/81  bone]
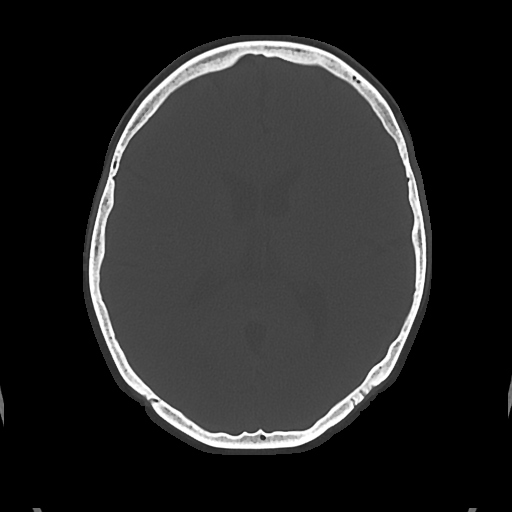

[Series 5: cor soft · coronal · 0.33mm/px · 3 of 67 slices shown]
[im 23/67  brain]
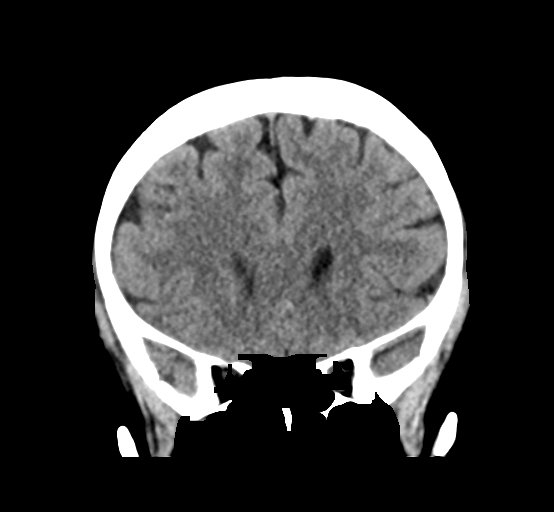
[im 30/67  brain]
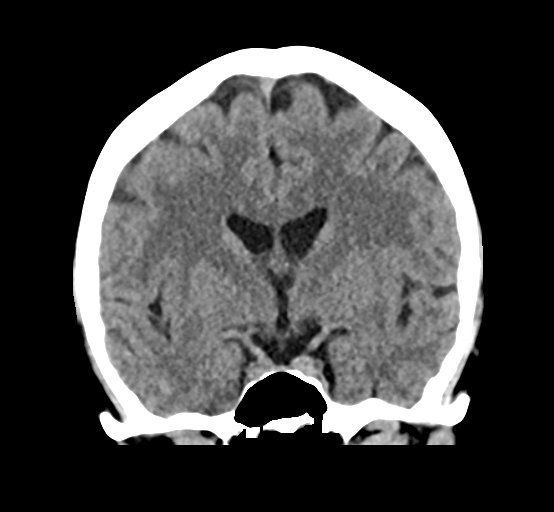
[im 37/67  brain]
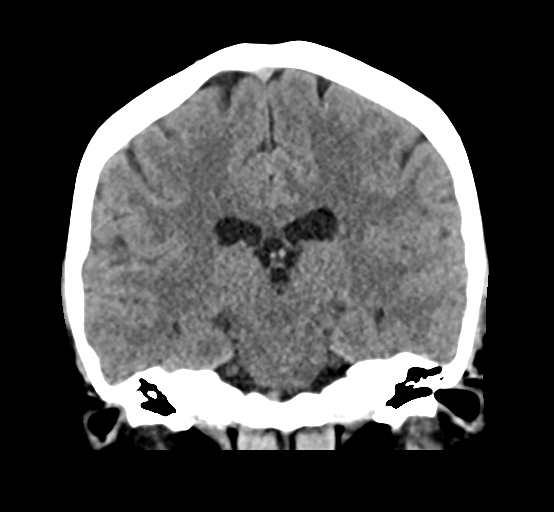

[Series 6: sag soft · sagittal · 0.35mm/px · 3 of 67 slices shown]
[im 23/67  brain]
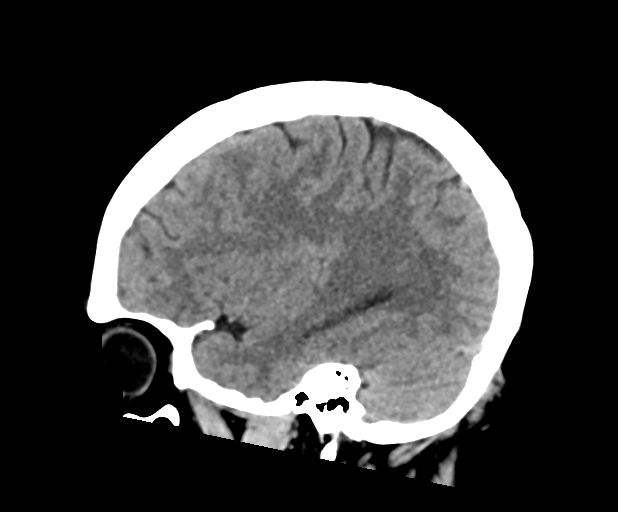
[im 34/67  brain]
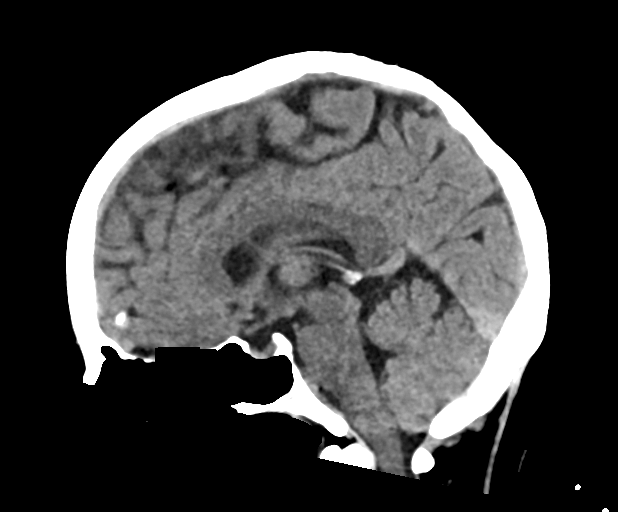
[im 45/67  brain]
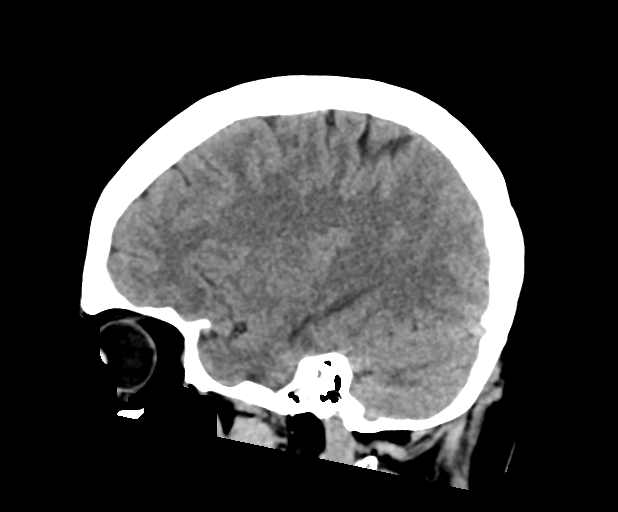

[17 of 47 positions shown; findings below may reference images not displayed]

FINDINGS: Brain: Ventricles are within normal limits. All areas of the brain
demonstrate appropriate gray-white matter differentiation. There is
no mass, hemorrhage, edema or other evidence of acute parenchymal
abnormality. No extra-axial hemorrhage.

Vascular: No hyperdense vessel or unexpected calcification.

Skull: Normal. Negative for fracture or focal lesion.

Sinuses/Orbits: No acute finding.

Other: None.
IMPRESSION: Negative head CT.  No intracranial mass, hemorrhage or edema.

## 2020-06-15 ENCOUNTER — Other Ambulatory Visit: Payer: 59 | Admitting: Women's Health

## 2020-06-30 ENCOUNTER — Other Ambulatory Visit: Payer: Self-pay | Admitting: Advanced Practice Midwife

## 2020-08-03 ENCOUNTER — Other Ambulatory Visit: Payer: Self-pay | Admitting: Advanced Practice Midwife

## 2020-10-25 ENCOUNTER — Other Ambulatory Visit: Payer: Self-pay | Admitting: Advanced Practice Midwife

## 2020-10-26 NOTE — Telephone Encounter (Signed)
Will need to schedule a Pap/physical- this will be the last refill until she comes in.  Sylvia Day [facility] 10/26/2020 8:52 AM

## 2020-11-21 ENCOUNTER — Other Ambulatory Visit: Payer: 59 | Admitting: Women's Health

## 2021-01-03 ENCOUNTER — Other Ambulatory Visit: Payer: Self-pay | Admitting: Women's Health

## 2021-01-03 MED ORDER — NORETHIN ACE-ETH ESTRAD-FE 1-20 MG-MCG PO TABS: 1.0000 | ORAL_TABLET | Freq: Every day | ORAL | 0 refills | Status: DC

## 2021-01-03 NOTE — Telephone Encounter (Signed)
Patient wants her Birth Control refilled. Patient stated that she's going to run out before her scheduled appointment on 02/08/21, per patient. Clinical staff will follow up with patient.

## 2021-02-08 ENCOUNTER — Encounter: Payer: Self-pay | Admitting: Women's Health

## 2021-02-08 ENCOUNTER — Ambulatory Visit: Payer: Medicaid Other | Admitting: Women's Health

## 2021-02-08 ENCOUNTER — Other Ambulatory Visit: Payer: Self-pay

## 2021-02-08 ENCOUNTER — Other Ambulatory Visit (HOSPITAL_COMMUNITY)
Admission: RE | Admit: 2021-02-08 | Discharge: 2021-02-08 | Disposition: A | Discharge status: Home / Self Care | Payer: Medicaid Other | Source: Ambulatory Visit | Attending: Advanced Practice Midwife | Admitting: Advanced Practice Midwife

## 2021-02-08 VITALS — BP 125/72 | HR 74 | Ht 59.0 in | Wt 116.0 lb

## 2021-02-08 DIAGNOSIS — A749 Chlamydial infection, unspecified: Secondary | ICD-10-CM | POA: Diagnosis not present

## 2021-02-08 DIAGNOSIS — Z01419 Encounter for gynecological examination (general) (routine) without abnormal findings: Secondary | ICD-10-CM | POA: Insufficient documentation

## 2021-02-08 NOTE — Progress Notes (Signed)
   WELL-WOMAN EXAMINATION Patient name: Sylvia Day MRN [id number]  Date of birth: 11-01-1986 Chief Complaint:   Gynecologic Exam  History of Present Illness:   Sylvia Day is a 35 y.o. G76P2002 Hispanic female being seen today for a routine well-woman exam.  Current complaints: dark blood when on period, no itching/odor/irritation  Depression screen [facility] 2/9 02/08/2021 04/10/2019 03/18/2019 07/01/2017  Decreased Interest 0 0 0 0  Down, Depressed, Hopeless 0 0 0 0  PHQ - 2 Score 0 0 0 0  Altered sleeping 0 - - -  Tired, decreased energy 0 - - -  Change in appetite 0 - - -  Feeling bad or failure about yourself  0 - - -  Trouble concentrating 0 - - -  Moving slowly or fidgety/restless 0 - - -  Suicidal thoughts 0 - - -  PHQ-9 Score 0 - - -     PCP: none      does not desire labs Patient's last menstrual period was 01/20/2021. The current method of family planning is OCP (estrogen/progesterone).  Last pap 07/01/17. Results were: NILM w/ HRHPV negative. H/O abnormal pap: no Last mammogram: never. Results were: N/A. Family h/o breast cancer: no Last colonoscopy: never. Results were: N/A. Family h/o colorectal cancer: no Review of Systems:   Pertinent items are noted in HPI Denies any headaches, blurred vision, fatigue, shortness of breath, chest pain, abdominal pain, abnormal vaginal discharge/itching/odor/irritation, problems with periods, bowel movements, urination, or intercourse unless otherwise stated above. Pertinent History Reviewed:  Reviewed past medical,surgical, social and family history.  Reviewed problem list, medications and allergies. Physical Assessment:   Vitals:   02/08/21 1333  BP: 125/72  Pulse: 74  Weight: 116 lb (52.6 kg)  Height: 4\' 11"  (1.499 m)  Body mass index is 23.43 kg/m.        Physical Examination:   General appearance - well appearing, and in no distress  Mental status - alert, oriented to person, place, and time  Psych:  She has a normal  mood and affect  Skin - warm and dry, normal color, no suspicious lesions noted  Chest - effort normal, all lung fields clear to auscultation bilaterally  Heart - normal rate and regular rhythm  Neck:  midline trachea, no thyromegaly or nodules  Breasts - breasts appear normal, no suspicious masses, no skin or nipple changes or  axillary nodes  Abdomen - soft, nontender, nondistended, no masses or organomegaly  Pelvic - VULVA: normal appearing vulva with no masses, tenderness or lesions  VAGINA: normal appearing vagina with normal color and discharge, no lesions  CERVIX: normal appearing cervix without discharge or lesions, no CMT  Thin prep pap is done w/ HR HPV cotesting  UTERUS: uterus is felt to be normal size, shape, consistency and nontender   ADNEXA: No adnexal masses or tenderness noted.  Extremities:  No swelling or varicosities noted  Chaperone:    No results found for this or any previous visit (from the past 24 hour(s)).  Assessment & Plan:  1) Well-Woman Exam  Labs/procedures today: pap  Mammogram: @ 36yo, or sooner if problems Colonoscopy: @ 36yo, or sooner if problems  No orders of the defined types were placed in this encounter.   Meds: No orders of the defined types were placed in this encounter.   Follow-up: Return in about 1 year (around 02/08/2022) for Physical.  02/10/2022 CNM, [facility] 02/08/2021 1:53 PM

## 2021-02-15 LAB — CYTOLOGY - PAP
Chlamydia: POSITIVE — AB
Comment: NEGATIVE
Comment: NEGATIVE
Comment: NEGATIVE
Comment: NORMAL
Diagnosis: UNDETERMINED — AB
HPV 16: NEGATIVE
HPV 18 / 45: NEGATIVE
High risk HPV: POSITIVE — AB
Neisseria Gonorrhea: NEGATIVE

## 2021-02-20 DIAGNOSIS — R87619 Unspecified abnormal cytological findings in specimens from cervix uteri: Secondary | ICD-10-CM | POA: Insufficient documentation

## 2021-02-20 DIAGNOSIS — A749 Chlamydial infection, unspecified: Secondary | ICD-10-CM | POA: Insufficient documentation

## 2021-02-20 MED ORDER — AZITHROMYCIN 500 MG PO TABS: 1000.0000 mg | ORAL_TABLET | Freq: Once | ORAL | 0 refills | Status: AC

## 2021-02-20 NOTE — Addendum Note (Signed)
Addended by: Shawna Clamp R on: 02/20/2021 01:01 PM   Modules accepted: Orders

## 2021-03-16 ENCOUNTER — Other Ambulatory Visit: Payer: Self-pay

## 2021-03-16 ENCOUNTER — Other Ambulatory Visit (HOSPITAL_COMMUNITY)
Admission: RE | Admit: 2021-03-16 | Discharge: 2021-03-16 | Disposition: A | Discharge status: Home / Self Care | Payer: Medicaid Other | Source: Ambulatory Visit | Attending: Obstetrics & Gynecology | Admitting: Obstetrics & Gynecology

## 2021-03-16 ENCOUNTER — Other Ambulatory Visit (INDEPENDENT_AMBULATORY_CARE_PROVIDER_SITE_OTHER): Payer: Medicaid Other

## 2021-03-16 DIAGNOSIS — A749 Chlamydial infection, unspecified: Secondary | ICD-10-CM | POA: Diagnosis not present

## 2021-03-16 NOTE — Progress Notes (Signed)
   NURSE VISIT- VAGINITIS/STD/POC  SUBJECTIVE:  Sylvia Day is a 35 y.o. [id number] GYN patientfemale here for a vaginal swab for proof of cure after treatment for Chlamydia.  She reports the following symptoms: none Denies abnormal vaginal bleeding, significant pelvic pain, fever, or UTI symptoms.  OBJECTIVE:  There were no vitals taken for this visit.  Appears well, in no apparent distress  ASSESSMENT: Vaginal swab for proof of cure after treatment for chlamydia  PLAN: Self-collected vaginal probe for Chlamydia sent to lab Treatment: to be determined once results are received Follow-up as needed if symptoms persist/worsen, or new symptoms develop  Grover [place]field A Johny Pitstick  03/16/2021 10:14 AM

## 2021-03-16 NOTE — Progress Notes (Signed)
Chart reviewed for nurse visit. Agree with plan of care.  Adline Potter, NP 03/16/2021 10:35 AM

## 2021-03-17 LAB — CERVICOVAGINAL ANCILLARY ONLY
Chlamydia: NEGATIVE
Comment: NEGATIVE
Comment: NORMAL
Neisseria Gonorrhea: NEGATIVE

## 2021-04-06 ENCOUNTER — Other Ambulatory Visit: Payer: Self-pay

## 2021-04-06 DIAGNOSIS — M67432 Ganglion, left wrist: Secondary | ICD-10-CM | POA: Diagnosis not present

## 2021-04-06 MED ORDER — NORETHIN ACE-ETH ESTRAD-FE 1-20 MG-MCG PO TABS: 1.0000 | ORAL_TABLET | Freq: Every day | ORAL | 3 refills | Status: DC

## 2021-05-16 ENCOUNTER — Encounter (HOSPITAL_COMMUNITY): Payer: Self-pay | Admitting: *Deleted

## 2021-05-16 ENCOUNTER — Other Ambulatory Visit: Payer: Self-pay

## 2021-05-16 ENCOUNTER — Emergency Department (HOSPITAL_COMMUNITY)
Admission: EM | Admit: 2021-05-16 | Discharge: 2021-05-16 | Disposition: A | Discharge status: Home / Self Care | Payer: Medicaid Other | Attending: Emergency Medicine | Admitting: Emergency Medicine

## 2021-05-16 DIAGNOSIS — M545 Low back pain, unspecified: Secondary | ICD-10-CM | POA: Diagnosis not present

## 2021-05-16 DIAGNOSIS — Z91040 Latex allergy status: Secondary | ICD-10-CM | POA: Insufficient documentation

## 2021-05-16 DIAGNOSIS — M5459 Other low back pain: Secondary | ICD-10-CM | POA: Diagnosis not present

## 2021-05-16 MED ORDER — METHOCARBAMOL 500 MG PO TABS: 500.0000 mg | ORAL_TABLET | Freq: Three times a day (TID) | ORAL | 0 refills | Status: DC | PRN

## 2021-05-16 MED ORDER — LIDOCAINE 4 % EX PTCH: 1.0000 | MEDICATED_PATCH | Freq: Two times a day (BID) | CUTANEOUS | 0 refills | Status: DC | PRN

## 2021-05-16 NOTE — ED Provider Notes (Signed)
[facility] EMERGENCY DEPARTMENT Provider Note   CSN: [id number] Arrival date & time: 05/16/21  1624     History Chief Complaint  Patient presents with   Back Pain    Sylvia Day is a 35 y.o. female with no pertinent past medical history.  Patient presents emerged part with a chief complaint of lumbar back pain.  Patient reports that pain has been present since Saturday 6/18.  Pain has been progressively worse since then.  Pain is aggravated with increased activity, movement, and touch.  Patient has had improvement of symptoms with ibuprofen and heat.  Patient rates her pain at present 5/10 on the pain scale.  Patient reports that pain started Saturday after she did extensive cleaning around the house including mopping.  Pain became worse today after she walked around shopping.  Has any recent falls or injuries.  Patient denies any fevers, chills, night sweats, unexpected weight loss, chest pain, shortness of breath, abdominal pain, nausea, vomiting, dysuria, hematuria, urinary frequency, numbness, weakness, facial asymmetry, slurred speech, bowel or bladder dysfunction.  Patient denies any history of cancer, IV drug use.   LMP 6/11   Back Pain Associated symptoms: no abdominal pain, no chest pain, no dysuria, no fever, no headaches, no numbness and no weakness       Past Medical History:  Diagnosis Date   Hives    Medical history non-contributory     Patient Active Problem List   Diagnosis Date Noted   Abnormal Pap smear of cervix 02/20/2021   Chlamydia 02/20/2021   Hair loss 07/01/2017   Irregular intermenstrual bleeding 07/01/2017   Vulvovaginitis due to yeast 10/25/2016    Past Surgical History:  Procedure Laterality Date   CESAREAN SECTION N/A 01/30/2017   Procedure: PRIMARY CESAREAN SECTION;  Surgeon: Lazaro Arms, MD;  Location: [facility] BIRTHING SUITES;  Service: Obstetrics;  Laterality: N/A;   NO PAST SURGERIES       OB History     Gravida  2   Para  2    Term  2   Preterm      AB      Living  2      SAB      IAB      Ectopic      Multiple  0   Live Births  2           Family History  Problem Relation Age of Onset   Stroke Father    Stroke Maternal Grandmother    Cancer Paternal Grandmother    Other Paternal Grandfather        disabled    Social History   Tobacco Use   Smoking status: Never   Smokeless tobacco: Never  Vaping Use   Vaping Use: Never used  Substance Use Topics   Alcohol use: No    Comment: occasional   Drug use: No    Home Medications Prior to Admission medications   Medication Sig Start Date End Date Taking? Authorizing Provider  cetirizine (ZYRTEC) 10 MG tablet Take one daily for runny nose or drainage. Patient taking differently: Take 10 mg by mouth daily as needed for allergies. Take one daily for runny nose or drainage. 12/30/15   Bobbitt, Heywood Iles, MD  EPINEPHrine 0.3 mg/0.3 mL IJ SOAJ injection Inject 0.3 mg into the muscle as needed (allergic reaction).     [provider]  norethindrone-ethinyl estradiol-FE (AUROVELA FE 1/20) 1-20 MG-MCG tablet Take 1 tablet by mouth daily. 04/06/21  Adline Potter, NP    Allergies    Latex and Percocet [oxycodone-acetaminophen]  Review of Systems   Review of Systems  Constitutional:  Negative for chills and fever.  Eyes:  Negative for visual disturbance.  Respiratory:  Negative for shortness of breath.   Cardiovascular:  Negative for chest pain.  Gastrointestinal:  Negative for abdominal pain, nausea and vomiting.  Genitourinary:  Negative for difficulty urinating and dysuria.  Musculoskeletal:  Positive for back pain. Negative for neck pain.  Skin:  Negative for color change and rash.  Neurological:  Negative for dizziness, tremors, seizures, syncope, facial asymmetry, speech difficulty, weakness, light-headedness, numbness and headaches.  Psychiatric/Behavioral:  Negative for confusion.    Physical Exam Updated Vital  Signs BP 129/88 (BP Location: Right Arm)   Pulse 68   Temp 98.5 F (36.9 C) (Oral)   Resp 14   Ht 4\' 11"  (1.499 m)   Wt 52.6 kg   LMP 05/06/2021   SpO2 100%   BMI 23.43 kg/m   Physical Exam Vitals and nursing note reviewed.  Constitutional:      General: She is not in acute distress.    Appearance: She is not ill-appearing, toxic-appearing or diaphoretic.  HENT:     Head: Atraumatic. No raccoon eyes, Battle's sign, abrasion, contusion, masses, right periorbital erythema, left periorbital erythema or laceration.     Jaw: No trismus or pain on movement.     Mouth/Throat:     Pharynx: Uvula midline. No pharyngeal swelling, oropharyngeal exudate, posterior oropharyngeal erythema or uvula swelling.  Eyes:     General: No scleral icterus.       Right eye: No discharge.        Left eye: No discharge.     Extraocular Movements: Extraocular movements intact.     Conjunctiva/sclera: Conjunctivae normal.     Pupils: Pupils are equal, round, and reactive to light.  Cardiovascular:     Rate and Rhythm: Normal rate.  Pulmonary:     Effort: Pulmonary effort is normal.  Abdominal:     General: There is no distension. There are no signs of injury.     Palpations: Abdomen is soft. There is no mass or pulsatile mass.     Tenderness: There is no abdominal tenderness. There is no right CVA tenderness, left CVA tenderness, guarding or rebound.     Hernia: There is no hernia in the umbilical area or ventral area.  Musculoskeletal:     Cervical back: Normal range of motion and neck supple. No swelling, edema, deformity, erythema, signs of trauma, lacerations, rigidity, spasms, torticollis, tenderness, bony tenderness or crepitus. No pain with movement. Normal range of motion.     Thoracic back: No swelling, edema, deformity, signs of trauma, lacerations, spasms, tenderness or bony tenderness. Normal range of motion.     Lumbar back: Tenderness present. No swelling, edema, deformity, signs of  trauma, lacerations, spasms or bony tenderness. Normal range of motion. Negative right straight leg raise test and negative left straight leg raise test.     Comments: Tenderness to bilateral lumbar paraspinal muscles   Skin:    General: Skin is warm and dry.  Neurological:     General: No focal deficit present.     Mental Status: She is alert and oriented to person, place, and time.     GCS: GCS eye subscore is 4. GCS verbal subscore is 5. GCS motor subscore is 6.     Cranial Nerves: No cranial nerve deficit or  facial asymmetry.     Sensory: Sensation is intact.     Motor: No weakness, tremor, seizure activity or pronator drift.     Coordination: Romberg sign negative. Finger-Nose-Finger Test normal.     Gait: Gait is intact. Gait and tandem walk normal.     Comments: CN II-XII intact, equal grip strength, +5 strength to bilateral upper and lower extremities, sensation to light touch intact to bilateral upper and lower extremities  Psychiatric:        Behavior: Behavior is cooperative.    ED Results / Procedures / Treatments   Labs (all labs ordered are listed, but only abnormal results are displayed) Labs Reviewed - No data to display  EKG None  Radiology No results found.  Procedures Procedures   Medications Ordered in ED Medications - No data to display  ED Course  I have reviewed the triage vital signs and the nursing notes.  Pertinent labs & imaging results that were available during my care of the patient were reviewed by me and considered in my medical decision making (see chart for details).    MDM Rules/Calculators/A&P                          Alert 35 year old female no acute distress, nontoxic-appearing patient presents emerged part with chief complaint of bilateral lumbar back pain.  Pain started after she was very active cleaning around the house.  Has no red flag symptoms for back pain.  Has no focal neurological deficits on physical exam.  No midline  tenderness or deformity to cervical, thoracic, or lumbar spine.  Patient has not had any recent falls or traumatic injuries.  Suspect that patient's symptoms are musculoskeletal in nature.  We will give patient prescription for Robaxin and lidocaine patches.  Patient advised to use over-the-counter pain medication.  Patient to follow-up with primary care provider if symptoms do not improve.  Patient given strict return precautions.  Patient expressed understanding of all instructions.  Final Clinical Impression(s) / ED Diagnoses Final diagnoses:  Acute bilateral low back pain without sciatica    Rx / DC Orders ED Discharge Orders          Ordered    methocarbamol (ROBAXIN) 500 MG tablet  Every 8 hours PRN        05/16/21 1902    Lidocaine (HM LIDOCAINE PATCH) 4 % PTCH  Every 12 hours PRN        05/16/21 1902             Berneice Heinrich 05/16/21 1917    Blane Ohara, MD 05/17/21 0010

## 2021-05-16 NOTE — Discharge Instructions (Addendum)
You came to the emergency department today to be evaluated for your low back pain.  Your physical exam was reassuring.  Pain is likely musculoskeletal in nature and should improve over time.  I have given you a prescription for muscle relaxer and lidocaine patches.  Please use these as prescribed.    Please take Ibuprofen (Advil, motrin) and Tylenol (acetaminophen) to relieve your pain.    You may take up to 600 MG (3 pills) of normal strength ibuprofen every 8 hours as needed.   You make take tylenol, up to 1,000 mg (two extra strength pills) every 8 hours as needed.   It is safe to take ibuprofen and tylenol at the same time as they work differently.   Do not take more than 3,000 mg tylenol in a 24 hour period (not more than one dose every 8 hours.  Please check all medication labels as many medications such as pain and cold medications may contain tylenol.  Do not drink alcohol while taking these medications.  Do not take other NSAID'S while taking ibuprofen (such as aleve or naproxen).  Please take ibuprofen with food to decrease stomach upset.  Today you were prescribed Methocarbamol (Robaxin).  Methocarbamol (Robaxin) is used to treat muscle spasms/pain.  It works by helping to relax the muscles.  Drowsiness, dizziness, lightheadedness, stomach upset, nausea/vomiting, or blurred vision may occur.  Do not drive, use machinery, or do anything that needs alertness or clear vision until you can do it safely.  Do not combine this medication with alcoholic beverages, marijuana, or other central nervous system depressants.    Get help right away if: You develop new bowel or bladder control problems. You have unusual weakness or numbness in your arms or legs. You develop nausea or vomiting. You develop abdominal pain. You feel faint.

## 2021-05-16 NOTE — ED Triage Notes (Signed)
Pt with lower back pain since mopping on Saturday. Has tried heating pad Ibuprofen with some relief.

## 2021-07-05 ENCOUNTER — Ambulatory Visit
Admission: EM | Admit: 2021-07-05 | Discharge: 2021-07-05 | Disposition: A | Discharge status: Home / Self Care | Payer: Medicaid Other | Attending: Family Medicine | Admitting: Family Medicine

## 2021-07-05 ENCOUNTER — Other Ambulatory Visit: Payer: Self-pay

## 2021-07-05 ENCOUNTER — Encounter: Payer: Self-pay | Admitting: Emergency Medicine

## 2021-07-05 DIAGNOSIS — L089 Local infection of the skin and subcutaneous tissue, unspecified: Secondary | ICD-10-CM | POA: Diagnosis not present

## 2021-07-05 DIAGNOSIS — B9689 Other specified bacterial agents as the cause of diseases classified elsewhere: Secondary | ICD-10-CM | POA: Diagnosis not present

## 2021-07-05 MED ORDER — DOXYCYCLINE HYCLATE 100 MG PO CAPS: 100.0000 mg | ORAL_CAPSULE | Freq: Two times a day (BID) | ORAL | 0 refills | Status: DC

## 2021-07-05 MED ORDER — TRIAMCINOLONE ACETONIDE 0.1 % EX CREA: 1.0000 "application " | TOPICAL_CREAM | Freq: Two times a day (BID) | CUTANEOUS | 0 refills | Status: DC

## 2021-07-05 NOTE — ED Triage Notes (Signed)
Blisters on RT toe that came up on Monday that are painful.

## 2021-07-05 NOTE — ED Provider Notes (Signed)
[facility]   [id number] 07/05/21 Arrival Time: 1628  ASSESSMENT & PLAN:  1. Superficial bacterial skin infection    Begin: Meds ordered this encounter  Medications   doxycycline (VIBRAMYCIN) 100 MG capsule    Sig: Take 1 capsule (100 mg total) by mouth 2 (two) times daily.    Dispense:  14 capsule    Refill:  0   triamcinolone cream (KENALOG) 0.1 %    Sig: Apply 1 application topically 2 (two) times daily.    Dispense:  15 g    Refill:  0   No abscess formation. Will follow up with PCP or here if worsening or failing to improve as anticipated. Reviewed expectations re: course of current medical issues. Questions answered. Outlined signs and symptoms indicating need for more acute intervention. Patient verbalized understanding. After Visit Summary given.   SUBJECTIVE:  Sylvia Day is a 35 y.o. female who presents with a skin complaint. Reports "blisters" on R great toe; slight worsening; noted 2 d ago; questions insect bite. Mild itching. Some pain..  OBJECTIVE: Vitals:   07/05/21 1638  BP: 127/79  Pulse: 67  Resp: 16  Temp: 98.3 F (36.8 C)  TempSrc: Oral  SpO2: 96%    General appearance: alert; no distress HEENT: [place]; AT Skin: warm and dry; R great toe with 2 clusters of small sterile pustules with surrounding erythema; no drainage or bleeding Psychological: alert and cooperative; normal mood and affect  Allergies  Allergen Reactions   Latex Other (See Comments)    irritation   Percocet [Oxycodone-Acetaminophen] Nausea And Vomiting    headache    Past Medical History:  Diagnosis Date   Hives    Medical history non-contributory    Social History   Socioeconomic History   Marital status: Single    Spouse name: Not on file   Number of children: 2   Years of education: Not on file   Highest education level: Not on file  Occupational History   Not on file  Tobacco Use   Smoking status: Never   Smokeless tobacco: Never  Vaping Use    Vaping Use: Never used  Substance and Sexual Activity   Alcohol use: No    Comment: occasional   Drug use: No   Sexual activity: Yes    Birth control/protection: Pill  Other Topics Concern   Not on file  Social History Narrative   Not on file   Social Determinants of Health   Financial Resource Strain: Low Risk    Difficulty of Paying Living Expenses: Not very hard  Food Insecurity: No Food Insecurity   Worried About Programme researcher, broadcasting/film/video in the Last Year: Never true   Ran Out of Food in the Last Year: Never true  Transportation Needs: No Transportation Needs   Lack of Transportation (Medical): No   Lack of Transportation (Non-Medical): No  Physical Activity: Insufficiently Active   Days of Exercise per Week: 2 days   Minutes of Exercise per Session: 40 min  Stress: No Stress Concern Present   Feeling of Stress : Only a little  Social Connections: Socially Isolated   Frequency of Communication with Friends and Family: More than three times a week   Frequency of Social Gatherings with Friends and Family: More than three times a week   Attends Religious Services: Never   Database administrator or Organizations: No   Attends Banker Meetings: Never   Marital Status: Never married  Intimate Partner Violence:  Not At Risk   Fear of Current or Ex-Partner: No   Emotionally Abused: No   Physically Abused: No   Sexually Abused: No   Family History  Problem Relation Age of Onset   Stroke Father    Stroke Maternal Grandmother    Cancer Paternal Grandmother    Other Paternal Grandfather        disabled   Past Surgical History:  Procedure Laterality Date   CESAREAN SECTION N/A 01/30/2017   Procedure: PRIMARY CESAREAN SECTION;  Surgeon: Lazaro Arms, MD;  Location: [facility]TH[facility] SUIT[facility];  Service: Obstetrics;  Laterality: N/A;   NO PAST SURGERIES        Mardella Layman, MD 07/08/21 1040

## 2022-01-03 ENCOUNTER — Other Ambulatory Visit: Payer: Medicaid Other

## 2022-01-03 ENCOUNTER — Encounter: Payer: Self-pay | Admitting: Obstetrics & Gynecology

## 2022-01-03 ENCOUNTER — Other Ambulatory Visit (HOSPITAL_COMMUNITY)
Admission: RE | Admit: 2022-01-03 | Discharge: 2022-01-03 | Disposition: A | Discharge status: Home / Self Care | Payer: Medicaid Other | Source: Ambulatory Visit | Attending: Obstetrics & Gynecology | Admitting: Obstetrics & Gynecology

## 2022-01-03 ENCOUNTER — Other Ambulatory Visit: Payer: Self-pay

## 2022-01-03 ENCOUNTER — Ambulatory Visit (INDEPENDENT_AMBULATORY_CARE_PROVIDER_SITE_OTHER): Payer: Medicaid Other | Admitting: Obstetrics & Gynecology

## 2022-01-03 VITALS — BP 130/82 | HR 69 | Ht 59.0 in | Wt 120.4 lb

## 2022-01-03 DIAGNOSIS — N898 Other specified noninflammatory disorders of vagina: Secondary | ICD-10-CM

## 2022-01-03 NOTE — Patient Instructions (Signed)
Try improving your good bacteria  Options include Women's probiotic, they typically will contain, Lactobacillus

## 2022-01-03 NOTE — Progress Notes (Signed)
° °  GYN VISIT Patient name: Sylvia Day MRN [id number]  Date of birth: 1986/04/11 Chief Complaint:   vaginal odor  History of Present Illness:   Sylvia Day is a 36 y.o. G32P2002 female being seen today for the following concerns:  Vaginal concerns: After her last period, she tried using boric acid to help with the odor from the blood.  Since then she has noted an irregular odor.  Not fishy or metallic odor, it's just different.  Denies vaginal discharge.  Denies itching, burning or discomfort.  No other acute complaints  Patient's last menstrual period was 12/20/2021.  Depression screen [facility] 2/9 02/08/2021 04/10/2019 03/18/2019 07/01/2017  Decreased Interest 0 0 0 0  Down, Depressed, Hopeless 0 0 0 0  PHQ - 2 Score 0 0 0 0  Altered sleeping 0 - - -  Tired, decreased energy 0 - - -  Change in appetite 0 - - -  Feeling bad or failure about yourself  0 - - -  Trouble concentrating 0 - - -  Moving slowly or fidgety/restless 0 - - -  Suicidal thoughts 0 - - -  PHQ-9 Score 0 - - -     Review of Systems:   Pertinent items are noted in HPI Denies fever/chills, dizziness, headaches, visual disturbances, fatigue, shortness of breath, chest pain, abdominal pain, vomiting, denies problems with periods, bowel movements, urination, or intercourse unless otherwise stated above.  Pertinent History Reviewed:  Reviewed past medical,surgical, social, obstetrical and family history.  Reviewed problem list, medications and allergies. Physical Assessment:   BP 130/82 (BP Location: Right Arm, Patient Position: Sitting, Cuff Size: Normal)    Pulse 69    Ht 4\' 11"  (1.499 m)    Wt 120 lb 6.4 oz (54.6 kg)    LMP 12/20/2021    BMI 24.32 kg/m        Physical Examination:   General appearance: alert, well appearing, and in no distress  Psych: mood appropriate, normal affect  Skin: warm & dry   Cardiovascular: normal heart rate noted  Respiratory: normal respiratory effort, no distress  Abdomen: soft,  non-tender   Pelvic: VULVA: normal appearing vulva with no masses, tenderness or lesions, VAGINA: normal appearing vagina with normal color and discharge, no lesions, CERVIX: normal appearing cervix without discharge or lesions  Extremities: no edema   Chaperone:  pt declined     Assessment & Plan:  1) Vaginal odor -will r/o underlying infection, further management pending results -suspect disruption in normal vaginal flora -recommend probiotic/increase lactobacillus    Return if symptoms worsen or fail to improve, for print AVS.   Janyth Pupa, DO Attending [place], [place] for [place]

## 2022-01-05 LAB — CERVICOVAGINAL ANCILLARY ONLY
Bacterial Vaginitis (gardnerella): NEGATIVE
Candida Glabrata: NEGATIVE
Candida Vaginitis: NEGATIVE
Comment: NEGATIVE
Comment: NEGATIVE
Comment: NEGATIVE

## 2022-01-18 ENCOUNTER — Other Ambulatory Visit: Payer: Self-pay

## 2022-01-18 ENCOUNTER — Other Ambulatory Visit: Payer: Medicaid Other

## 2022-01-19 ENCOUNTER — Other Ambulatory Visit (INDEPENDENT_AMBULATORY_CARE_PROVIDER_SITE_OTHER): Payer: Medicaid Other | Admitting: *Deleted

## 2022-01-19 DIAGNOSIS — R300 Dysuria: Secondary | ICD-10-CM

## 2022-01-19 LAB — POCT URINALYSIS DIPSTICK OB
Glucose, UA: NEGATIVE
Ketones, UA: NEGATIVE
Nitrite, UA: NEGATIVE
POC,PROTEIN,UA: NEGATIVE

## 2022-01-19 NOTE — Progress Notes (Signed)
° °  NURSE VISIT- UTI SYMPTOMS   SUBJECTIVE:  Sylvia Day is a 36 y.o. G34P2002 female here for UTI symptoms. She is a GYN patient. She reports dysuria.  OBJECTIVE:  LMP 12/20/2021   Appears well, in no apparent distress  Results for orders placed or performed in visit on 01/19/22 (from the past 24 hour(s))  POC Urinalysis Dipstick OB   Collection Time: 01/19/22  9:01 AM  Result Value Ref Range   Color, UA     Clarity, UA     Glucose, UA Negative Negative   Bilirubin, UA     Ketones, UA neg    Spec Grav, UA     Blood, UA Small    pH, UA     POC,PROTEIN,UA Negative Negative, Trace, Small (1+), Moderate (2+), Large (3+), 4+   Urobilinogen, UA     Nitrite, UA neg    Leukocytes, UA Moderate (2+) (A) Negative   Appearance     Odor      ASSESSMENT: GYN patient with UTI symptoms and negative nitrites  PLAN: Note routed to Cyril Mourning, AGNP   Rx sent by provider today: No Urine culture sent Call or return to clinic prn if these symptoms worsen or fail to improve as anticipated. Follow-up: as needed   Sylvia Day  01/19/2022 9:03 AM

## 2022-01-20 LAB — URINALYSIS
Bilirubin, UA: NEGATIVE
Glucose, UA: NEGATIVE
Ketones, UA: NEGATIVE
Nitrite, UA: NEGATIVE
Protein,UA: NEGATIVE
Specific Gravity, UA: 1.012 (ref 1.005–1.030)
Urobilinogen, Ur: 0.2 mg/dL (ref 0.2–1.0)
pH, UA: 6.5 (ref 5.0–7.5)

## 2022-01-23 LAB — URINE CULTURE

## 2022-02-12 ENCOUNTER — Other Ambulatory Visit: Payer: Self-pay | Admitting: Adult Health

## 2022-02-19 ENCOUNTER — Other Ambulatory Visit: Payer: Self-pay

## 2022-02-19 ENCOUNTER — Encounter (HOSPITAL_COMMUNITY): Payer: Self-pay | Admitting: Emergency Medicine

## 2022-02-19 DIAGNOSIS — W57XXXA Bitten or stung by nonvenomous insect and other nonvenomous arthropods, initial encounter: Secondary | ICD-10-CM | POA: Insufficient documentation

## 2022-02-19 DIAGNOSIS — Z5321 Procedure and treatment not carried out due to patient leaving prior to being seen by health care provider: Secondary | ICD-10-CM | POA: Insufficient documentation

## 2022-02-19 DIAGNOSIS — R109 Unspecified abdominal pain: Secondary | ICD-10-CM | POA: Diagnosis not present

## 2022-02-19 NOTE — ED Triage Notes (Addendum)
Pt states she was bit by a spider this afternoon. Here now with area of redness surrioundng bite area. Also c/o stomach cramps that she believes are related to spider-bite. ?

## 2022-02-20 ENCOUNTER — Ambulatory Visit
Admission: EM | Admit: 2022-02-20 | Discharge: 2022-02-20 | Disposition: A | Discharge status: Home / Self Care | Payer: Medicaid Other | Attending: Urgent Care | Admitting: Urgent Care

## 2022-02-20 ENCOUNTER — Other Ambulatory Visit: Payer: Self-pay

## 2022-02-20 ENCOUNTER — Emergency Department (HOSPITAL_COMMUNITY)
Admission: EM | Admit: 2022-02-20 | Discharge: 2022-02-20 | Disposition: A | Discharge status: Home / Self Care | Payer: Medicaid Other | Attending: Emergency Medicine | Admitting: Emergency Medicine

## 2022-02-20 ENCOUNTER — Encounter: Payer: Self-pay | Admitting: Emergency Medicine

## 2022-02-20 DIAGNOSIS — T148XXA Other injury of unspecified body region, initial encounter: Secondary | ICD-10-CM | POA: Diagnosis not present

## 2022-02-20 DIAGNOSIS — L249 Irritant contact dermatitis, unspecified cause: Secondary | ICD-10-CM | POA: Diagnosis not present

## 2022-02-20 DIAGNOSIS — L989 Disorder of the skin and subcutaneous tissue, unspecified: Secondary | ICD-10-CM

## 2022-02-20 MED ORDER — HYDROXYZINE HCL 25 MG PO TABS: 12.5000 mg | ORAL_TABLET | Freq: Three times a day (TID) | ORAL | 0 refills | Status: DC | PRN

## 2022-02-20 MED ORDER — BETAMETHASONE DIPROPIONATE 0.05 % EX OINT: TOPICAL_OINTMENT | Freq: Two times a day (BID) | CUTANEOUS | 0 refills | Status: DC

## 2022-02-20 NOTE — ED Triage Notes (Signed)
Woke up with bite on right forearm.  Yesterday area started to swell.  States area itches.  States stomach has been cramping ?

## 2022-02-20 NOTE — ED Provider Notes (Signed)
?[facility] ? ? ?MRN: [id number] DOB: 10/05/1986 ? ?Subjective:  ? ?Sylvia Day is a 36 y.o. female presenting for suffering a bite wound in her sleep a day ago.  Patient does not know what bit her.  She did have significant itching, redness and swelling.  Today it has improved but she was worried as her friends told her that it could be a venomous spider that bit her.  Patient denies any particular facial or oral swelling, shortness of breath, vomiting.  She did have some stomach cramping but this has resolved.  No chance of pregnancy. ? ?No current facility-administered medications for this encounter. ? ?Current Outpatient Medications:  ?  BLISOVI FE 1/20 1-20 MG-MCG tablet, TAKE 1 TABLET BY MOUTH DAILY, Disp: 84 tablet, Rfl: 0 ?  cetirizine (ZYRTEC) 10 MG tablet, Take one daily for runny nose or drainage. (Patient taking differently: Take 10 mg by mouth daily as needed for allergies. Take one daily for runny nose or drainage.), Disp: 30 tablet, Rfl: 0 ?  doxycycline (VIBRAMYCIN) 100 MG capsule, Take 1 capsule (100 mg total) by mouth 2 (two) times daily. (Patient not taking: Reported on 01/03/2022), Disp: 14 capsule, Rfl: 0 ?  EPINEPHrine 0.3 mg/0.3 mL IJ SOAJ injection, Inject 0.3 mg into the muscle as needed (allergic reaction).  (Patient not taking: Reported on 01/03/2022), Disp: , Rfl:  ?  Lidocaine (HM LIDOCAINE PATCH) 4 % PTCH, Apply 1 patch topically every 12 (twelve) hours as needed. (Patient not taking: Reported on 01/03/2022), Disp: 30 patch, Rfl: 0 ?  methocarbamol (ROBAXIN) 500 MG tablet, Take 1 tablet (500 mg total) by mouth every 8 (eight) hours as needed for muscle spasms. (Patient not taking: Reported on 01/03/2022), Disp: 20 tablet, Rfl: 0 ?  triamcinolone cream (KENALOG) 0.1 %, Apply 1 application topically 2 (two) times daily. (Patient not taking: Reported on 01/03/2022), Disp: 15 g, Rfl: 0  ? ?Allergies  ?Allergen Reactions  ? Latex Other (See Comments)  ?  irritation  ? Percocet  [Oxycodone-Acetaminophen] Nausea And Vomiting  ?  headache  ? ? ?Past Medical History:  ?Diagnosis Date  ? Hives   ? Medical history non-contributory   ?  ? ?Past Surgical History:  ?Procedure Laterality Date  ? CESAREAN SECTION N/A 01/30/2017  ? Procedure: PRIMARY CESAREAN SECTION;  Surgeon: Lazaro Arms, MD;  Location: [facility];  Service: Obstetrics;  Laterality: N/A;  ? NO PAST SURGERIES    ? ? ?Family History  ?Problem Relation Age of Onset  ? Stroke Father   ? Stroke Maternal Grandmother   ? Cancer Paternal Grandmother   ? Other Paternal Grandfather   ?     disabled  ? ? ?Social History  ? ?Tobacco Use  ? Smoking status: Never  ? Smokeless tobacco: Never  ?Vaping Use  ? Vaping Use: Never used  ?Substance Use Topics  ? Alcohol use: No  ?  Comment: occasional  ? Drug use: No  ? ? ?ROS ? ? ?Objective:  ? ?Vitals: ?BP 110/80 (BP Location: Right Arm)   Pulse 82   Temp 98.1 ?F (36.7 ?C) (Oral)   Resp 18   LMP 02/07/2022 (Exact Date)   SpO2 97%  ? ?Physical Exam ?Constitutional:   ?   General: She is not in acute distress. ?   Appearance: Normal appearance. She is well-developed. She is not ill-appearing, toxic-appearing or diaphoretic.  ?HENT:  ?   Head: Normocephalic and atraumatic.  ?   Nose: Nose normal.  ?  Mouth/Throat:  ?   Mouth: Mucous membranes are moist.  ?Eyes:  ?   General: No scleral icterus.    ?   Right eye: No discharge.     ?   Left eye: No discharge.  ?   Extraocular Movements: Extraocular movements intact.  ?Cardiovascular:  ?   Rate and Rhythm: Normal rate.  ?Pulmonary:  ?   Effort: Pulmonary effort is normal.  ?Skin: ?   General: Skin is warm and dry.  ? ?    ?Neurological:  ?   General: No focal deficit present.  ?   Mental Status: She is alert and oriented to person, place, and time.  ?Psychiatric:     ?   Mood and Affect: Mood normal.     ?   Behavior: Behavior normal.  ? ? ? ? ? ?Assessment and Plan :  ? ?PDMP not reviewed this encounter. ? ?1. Irritant contact dermatitis,  unspecified trigger   ?2. Bite wound   ? ?Suspect an irritant contact dermatitis secondary to an undifferentiated bite wound.  No signs of an infectious process.  Recommended betamethasone ointment, hydroxyzine for itching. Counseled patient on potential for adverse effects with medications prescribed/recommended today, ER and return-to-clinic precautions discussed, patient verbalized understanding. ? ?  ?Wallis Bamberg, PA-C ?02/20/22 1441 ? ?

## 2022-02-20 NOTE — ED Notes (Signed)
Pt seen walking down hallway and out doors. Pt stated "will go to urgent care". ?

## 2022-02-20 NOTE — ED Provider Notes (Signed)
Patient left without being seen ?  ?Zadie Rhine, MD ?02/20/22 [phone] ? ?

## 2022-02-21 ENCOUNTER — Telehealth: Payer: Self-pay

## 2022-02-21 NOTE — Telephone Encounter (Signed)
Transition Care Management Unsuccessful Follow-up Telephone Call ? ?Date of discharge and from where:  02/20/2022 from [facility] ? ?Attempts:  1st Attempt ? ?Reason for unsuccessful TCM follow-up call:  Unable to leave message ? ? ? ?

## 2022-02-22 NOTE — Telephone Encounter (Signed)
Transition Care Management Unsuccessful Follow-up Telephone Call ? ?Date of discharge and from where:  02/20/2022 from [facility] ? ?Attempts:  2nd Attempt ? ?Reason for unsuccessful TCM follow-up call:  Unable to leave message ? ? ? ?

## 2022-02-23 NOTE — Telephone Encounter (Signed)
Transition Care Management Unsuccessful Follow-up Telephone Call ? ?Date of discharge and from where:  02/20/2022-[facility] ? ?Attempts:  3rd Attempt ? ?Reason for unsuccessful TCM follow-up call:  Unable to leave message ? ?  ?

## 2022-05-05 ENCOUNTER — Other Ambulatory Visit: Payer: Self-pay | Admitting: Adult Health

## 2022-07-28 ENCOUNTER — Other Ambulatory Visit: Payer: Self-pay | Admitting: Adult Health

## 2022-08-04 ENCOUNTER — Other Ambulatory Visit: Payer: Self-pay | Admitting: Adult Health

## 2022-08-17 ENCOUNTER — Encounter: Payer: Self-pay | Admitting: Obstetrics & Gynecology

## 2022-08-17 ENCOUNTER — Other Ambulatory Visit: Payer: Self-pay | Admitting: Adult Health

## 2022-08-17 MED ORDER — FLUCONAZOLE 150 MG PO TABS: ORAL_TABLET | ORAL | 1 refills | Status: DC

## 2022-08-17 NOTE — Progress Notes (Signed)
Rx diflucan.  

## 2022-09-12 ENCOUNTER — Other Ambulatory Visit (INDEPENDENT_AMBULATORY_CARE_PROVIDER_SITE_OTHER): Payer: Medicaid Other | Admitting: *Deleted

## 2022-09-12 ENCOUNTER — Other Ambulatory Visit (HOSPITAL_COMMUNITY)
Admission: RE | Admit: 2022-09-12 | Discharge: 2022-09-12 | Disposition: A | Discharge status: Home / Self Care | Payer: Medicaid Other | Source: Ambulatory Visit | Attending: Obstetrics & Gynecology | Admitting: Obstetrics & Gynecology

## 2022-09-12 DIAGNOSIS — N898 Other specified noninflammatory disorders of vagina: Secondary | ICD-10-CM | POA: Insufficient documentation

## 2022-09-12 NOTE — Progress Notes (Signed)
   NURSE VISIT- VAGINITIS/STD  SUBJECTIVE:  Sylvia Day is a 36 y.o. [id number] GYN patientfemale here for a vaginal swab for vaginitis screening, STD screen.  She reports the following symptoms:  vaginal itching and vaginal discharge  for 1 day. Has recently been on an antibiotic. Denies abnormal vaginal bleeding, significant pelvic pain, fever, or UTI symptoms.  OBJECTIVE:  There were no vitals taken for this visit.  Appears well, in no apparent distress  ASSESSMENT: Vaginal swab for vaginitis screening & STD screening.   PLAN: Self-collected vaginal probe for Gonorrhea, Chlamydia, Trichomonas, Bacterial Vaginosis, Yeast sent to lab Treatment: to be determined once results are received Follow-up as needed if symptoms persist/worsen, or new symptoms develop  Levy Pupa  09/12/2022 3:04 PM

## 2022-09-14 ENCOUNTER — Encounter: Payer: Self-pay | Admitting: Obstetrics & Gynecology

## 2022-09-14 ENCOUNTER — Other Ambulatory Visit: Payer: Self-pay | Admitting: Adult Health

## 2022-09-14 LAB — CERVICOVAGINAL ANCILLARY ONLY
Bacterial Vaginitis (gardnerella): POSITIVE — AB
Candida Glabrata: NEGATIVE
Candida Vaginitis: POSITIVE — AB
Chlamydia: NEGATIVE
Comment: NEGATIVE
Comment: NEGATIVE
Comment: NEGATIVE
Comment: NEGATIVE
Comment: NEGATIVE
Comment: NORMAL
Neisseria Gonorrhea: NEGATIVE
Trichomonas: NEGATIVE

## 2022-09-14 MED ORDER — FLUCONAZOLE 150 MG PO TABS: ORAL_TABLET | ORAL | 1 refills | Status: DC

## 2022-09-14 MED ORDER — METRONIDAZOLE 500 MG PO TABS: 500.0000 mg | ORAL_TABLET | Freq: Two times a day (BID) | ORAL | 0 refills | Status: DC

## 2022-09-14 NOTE — Progress Notes (Signed)
Rx diflucan.  

## 2022-09-14 NOTE — Progress Notes (Signed)
+  BV on vaginal swab will rx flagyl, was also + yeast, rx sent earlier for diflucan

## 2022-10-02 ENCOUNTER — Encounter: Payer: Self-pay | Admitting: Obstetrics & Gynecology

## 2022-10-02 ENCOUNTER — Encounter: Payer: Self-pay | Admitting: Adult Health

## 2022-10-02 ENCOUNTER — Other Ambulatory Visit (HOSPITAL_COMMUNITY)
Admission: RE | Admit: 2022-10-02 | Discharge: 2022-10-02 | Disposition: A | Discharge status: Home / Self Care | Payer: Medicaid Other | Source: Ambulatory Visit | Attending: Adult Health | Admitting: Adult Health

## 2022-10-02 ENCOUNTER — Ambulatory Visit: Payer: Medicaid Other | Admitting: Adult Health

## 2022-10-02 VITALS — BP 130/79 | HR 79 | Ht 59.0 in | Wt 117.5 lb

## 2022-10-02 DIAGNOSIS — R102 Pelvic and perineal pain: Secondary | ICD-10-CM

## 2022-10-02 DIAGNOSIS — R87610 Atypical squamous cells of undetermined significance on cytologic smear of cervix (ASC-US): Secondary | ICD-10-CM | POA: Diagnosis not present

## 2022-10-02 DIAGNOSIS — Z124 Encounter for screening for malignant neoplasm of cervix: Secondary | ICD-10-CM | POA: Insufficient documentation

## 2022-10-02 NOTE — Progress Notes (Signed)
  Subjective:     Patient ID: Sylvia Day, female   DOB: Dec 31, 1985, 36 y.o.   MRN: [id number]  HPI Sylvia Day is a 36 year old Hispanic female, single, [id number], in complaining of pain in inside but more to low back than front of pelvic area, like a cramp but not, started Sunday. And she needs pap. Last pap was ASCUS +HR HPV 02/08/21.   Review of Systems Pain in inside but more to low back, like a cramp but not, started Sunday Denies problems with urination, bowel movements or sex Has been treated for BV and yeast in October  Reviewed past medical,surgical, social and family history. Reviewed medications and allergies.     Objective:   Physical Exam BP 130/79 (BP Location: Left Arm, Patient Position: Sitting, Cuff Size: Normal)   Pulse 79   Ht 4\' 11"  (1.499 m)   Wt 117 lb 8 oz (53.3 kg)   LMP 09/23/2022   BMI 23.73 kg/m     Skin warm and dry.Pelvic: external genitalia is normal in appearance no lesions, vagina: white discharge without odor,urethra has no lesions or masses noted, cervix:bulbous,pap with GC/CHL and HR HPV genotyping performed, no CMT, uterus: normal size, shape and contour, non tender, no masses felt, adnexa: no masses or tenderness noted. Bladder is non tender and no masses felt. No point tenderness low back. No problems with ROM.  Fall risk is low  [facility] - 10/02/22 1457       Pregnancy Intention Screening   Does the patient want to become pregnant in the next year? No    Does the patient's partner want to become pregnant in the next year? No    Would the patient like to discuss contraceptive options today? No      Contraception Wrap Up   Current Method Oral Contraceptive    End Method Oral Contraceptive            Examination chaperoned by Levy Pupa LPN  Assessment:     1. Routine Papanicolaou smear Pap sent Pap in 3 years if normal  2. Atypical squamous cells of undetermined significance on cytologic smear of cervix (ASC-US) Pap sent  3.  Pelvic pain Pain in inside but more to low back, like a cramp but not, started Sunday She sits at work Will try alternating heat and ice, and has muscle relaxant at home May get massage If persists will get pelvic US    Plan:     Follow up prn

## 2022-10-05 LAB — CYTOLOGY - PAP
Chlamydia: NEGATIVE
Comment: NEGATIVE
Comment: NEGATIVE
Comment: NEGATIVE
Comment: NEGATIVE
Comment: NORMAL
HPV 16: NEGATIVE
HPV 18 / 45: NEGATIVE
High risk HPV: POSITIVE — AB
Neisseria Gonorrhea: NEGATIVE

## 2022-10-08 ENCOUNTER — Telehealth: Payer: Self-pay | Admitting: Adult Health

## 2022-10-08 DIAGNOSIS — R87612 Low grade squamous intraepithelial lesion on cytologic smear of cervix (LGSIL): Secondary | ICD-10-CM | POA: Insufficient documentation

## 2022-10-08 NOTE — Telephone Encounter (Signed)
Left message that pap was +HPV LSIL, please call office for colpo appt

## 2023-06-25 ENCOUNTER — Telehealth: Payer: BC Managed Care – PPO | Admitting: Physician Assistant

## 2023-06-25 DIAGNOSIS — R5383 Other fatigue: Secondary | ICD-10-CM

## 2023-06-25 DIAGNOSIS — R4589 Other symptoms and signs involving emotional state: Secondary | ICD-10-CM | POA: Diagnosis not present

## 2023-06-25 DIAGNOSIS — F411 Generalized anxiety disorder: Secondary | ICD-10-CM | POA: Diagnosis not present

## 2023-06-25 DIAGNOSIS — L659 Nonscarring hair loss, unspecified: Secondary | ICD-10-CM

## 2023-06-25 MED ORDER — BUSPIRONE HCL 5 MG PO TABS: 5.0000 mg | ORAL_TABLET | Freq: Two times a day (BID) | ORAL | 0 refills | Status: AC

## 2023-06-25 NOTE — Patient Instructions (Addendum)
Sylvia Day, thank you for joining [name], PA-C for today's virtual visit.  While this provider is not your primary care provider (PCP), if your PCP is located in our provider database this encounter information will be shared with them immediately following your visit.   A [facility] MyChart account gives you access to today's visit and all your visits, tests, and labs performed at [facility] " click here if you don't have a [facility] MyChart account or go to [email or web address]  Consent: (Patient) Sylvia Day provided verbal consent for this virtual visit at the beginning of the encounter.  Current Medications:  Current Outpatient Medications:    BLISOVI FE 1/20 1-20 MG-MCG tablet, TAKE 1 TABLET BY MOUTH DAILY, Disp: 84 tablet, Rfl: 3   cetirizine (ZYRTEC) 10 MG tablet, Take one daily for runny nose or drainage. (Patient taking differently: Take 10 mg by mouth daily as needed for allergies. Take one daily for runny nose or drainage.), Disp: 30 tablet, Rfl: 0   Medications ordered in this encounter:  No orders of the defined types were placed in this encounter.    *If you need refills on other medications prior to your next appointment, please contact your pharmacy*  Follow-Up: Call back or seek an in-person evaluation if the symptoms worsen or if the condition fails to improve as anticipated.  [facility] [phone]  Other Instructions Please restart the Buspar, taking as directed. Follow-up recommendations below for sleep hygiene. Please call your GYN to schedule an appt for further evaluation. You can use the link below to help get set up with a Primary Care Provider.  I have placed resources for counseling as well that you should be able to set up.  Sleep Hygiene  Do: (1) Go to bed at the same time each day. (2) Get up from bed at the same time each day. (3) Get regular exercise each day, preferably in the  morning.  There is goof evidence that regular exercise improves restful sleep.  This includes stretching and aerobic exercise. (4) Get regular exposure to outdoor or bright lights, especially in the late afternoon. (5) Keep the temperature in your bedroom comfortable. (6) Keep the bedroom quiet when sleeping. (7) Keep the bedroom dark enough to facilitate sleep. (8) Use your bed only for sleep and sex. (9) Take medications as directed.  It is helpful to take prescribed sleeping pills 1 hour before bedtime, so they are causing drowsiness when you lie down, or 10 hours before getting up, to avoid daytime drowsiness. (10) Use a relaxation exercise just before going to sleep -- imagery, massage, warm bath. (11) Keep your feet and hands warm.  Wear warm socks and/or mittens or gloves to bed.  Don't: (1) Exercise just before going to bed. (2) Engage in stimulating activity just before bed, such as playing a competitive game, watching an exciting program on television, or having an important discussion with a loved one. (3) Have caffeine in the evening (coffee, teas, chocolate, sodas, etc.) (4) Read or watch television in bed. (5) Use alcohol to help you sleep. (6) Go to bed too hungry or too full. (7) Take another person's sleeping pills. (8) Take over-the-counter sleeping pills, without your doctor's knowledge.  Tolerance can develop rapidly with these medications.  Diphenhydramine can have serious side effects for elderly patients. (9) Take daytime naps. (10) Command yourself to go to sleep.  This only makes your mind and body more alert.  If you lie  awake for more than 20-30 minutes, get up, go to a different room, participate in a quiet activity (Ex - non-excitable reading or television), and then return to bed when you feel sleepy.  Do this as many times during the night as needed.  This may cause you to have a night or two of poor sleep but it will train your brain to know when it is time for  sleep.     If you have been instructed to have an in-person evaluation today at a local Urgent Care facility, please use the link below. It will take you to a list of all of our available [facility], including address, phone number and hours of operation. Please do not delay care.  [facility]  If you or a family member do not have a primary care provider, use the link below to schedule a visit and establish care. When you choose a [facility] primary care physician or advanced practice provider, you gain a long-term partner in health. Find a Primary Care Provider  Learn more about [facility]'s in-office and virtual care options:  - Get Care Now

## 2023-06-25 NOTE — Progress Notes (Signed)
Virtual Visit Consent   Sylvia Day, you are scheduled for a virtual visit with a [facility] provider today. Just as with appointments in the office, your consent must be obtained to participate. Your consent will be active for this visit and any virtual visit you may have with one of our providers in the next 365 days. If you have a MyChart account, a copy of this consent can be sent to you electronically.  As this is a virtual visit, video technology does not allow for your provider to perform a traditional examination. This may limit your provider's ability to fully assess your condition. If your provider identifies any concerns that need to be evaluated in person or the need to arrange testing (such as labs, EKG, etc.), we will make arrangements to do so. Although advances in technology are sophisticated, we cannot ensure that it will always work on either your end or our end. If the connection with a video visit is poor, the visit may have to be switched to a telephone visit. With either a video or telephone visit, we are not always able to ensure that we have a secure connection.  By engaging in this virtual visit, you consent to the provision of healthcare and authorize for your insurance to be billed (if applicable) for the services provided during this visit. Depending on your insurance coverage, you may receive a charge related to this service.  I need to obtain your verbal consent now. Are you willing to proceed with your visit today? Sylvia Day has provided verbal consent on 06/25/2023 for a virtual visit (video or telephone). Sylvia Day, [place]  Date: 06/25/2023 1:34 PM  Virtual Visit via Video Note   I, Sylvia Day, connected with  Sylvia Day  ([id number], [birth year]) on 06/25/23 at  1:15 PM EDT by a video-enabled telemedicine application and verified that I am speaking with the correct person using two identifiers.  Location: Patient: Virtual Visit  Location Patient: Home Provider: Virtual Visit Location Provider: Home Office   I discussed the limitations of evaluation and management by telemedicine and the availability of in person appointments. The patient expressed understanding and agreed to proceed.    History of Present Illness: Sylvia Day is a 36 y.o. who identifies as a female who was assigned female at birth, and is being seen today for discussion of multiple symptoms. Notes she has been having an issue over the past month with thinning and loss of hair of her scalp, noting more than usual coming out with brushing. Denies change to products used. Some weight gain but notes not eating the best. No noted cold intolerance or change to bowel habits. Notes some issue in the past year with depressed mood, anhedonia and increased episodes of anxiety without full-blown panic. Notes that she started working at [employer] last year which is stressful. Is also a single mother and in a battle with ex regarding child support. Has also restarted school. 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 Not on this for some time. Denies SI/HI.   HPI: HPI  Problems:  Patient Active Problem List   Diagnosis Date Noted   LGSIL of cervix of undetermined significance 10/08/2022   Pelvic pain 10/02/2022   Routine Papanicolaou smear 10/02/2022   Abnormal Pap smear of cervix 02/20/2021   Chlamydia 02/20/2021   Hair loss 07/01/2017   Irregular intermenstrual bleeding 07/01/2017  Vulvovaginitis due to yeast 10/25/2016    Allergies:  Allergies  Allergen Reactions   Latex Other (See Comments)    irritation   Percocet [Oxycodone-Acetaminophen] Nausea And Vomiting    headache   Medications:  Current Outpatient Medications:    busPIRone (BUSPAR) 5 MG tablet, Take 1 tablet (5 mg total) by mouth 2 (two) times daily., Disp: 60 tablet, Rfl:  0  Observations/Objective: Patient is well-developed, well-nourished in no acute distress.  Resting comfortably  at home.  Head is normocephalic, atraumatic.  No labored breathing.  Speech is clear and coherent with logical content.  Patient is alert and oriented at baseline.   Assessment and Plan: 1. Depressed mood  2. Anxiety state - busPIRone (BUSPAR) 5 MG tablet; Take 1 tablet (5 mg total) by mouth 2 (two) times daily.  Dispense: 60 tablet; Refill: 0  3. Hair loss  4. Other fatigue  Needs further evaluation. This could all be related and indicative of underlying issue -- vitamin deficiencies, underactive thyroid, etc -- or could be symptoms os just primary anxiety/depression. She is currently without a PCP. Does have GYN so she is to call to schedule an evaluation. Resources to get established with a PCP given. Will have her start sleep hygiene practices. Resources for home measures and counseling resources given as well. Will restart Buspar for anxiety as she has taken before.   Follow Up Instructions: I discussed the assessment and treatment plan with the patient. The patient was provided an opportunity to ask questions and all were answered. The patient agreed with the plan and demonstrated an understanding of the instructions.  A copy of instructions were sent to the patient via MyChart unless otherwise noted below.   The patient was advised to call back or seek an in-person evaluation if the symptoms worsen or if the condition fails to improve as anticipated.  Time:  I spent 10 minutes with the patient via telehealth technology discussing the above problems/concerns.    Sylvia Climes, PA-C

## 2024-05-01 ENCOUNTER — Telehealth: Payer: Self-pay

## 2024-05-01 MED ORDER — FLUCONAZOLE 150 MG PO TABS: ORAL_TABLET | ORAL | 1 refills | Status: DC

## 2024-05-01 NOTE — Telephone Encounter (Signed)
 Patient would like for [facility] to send her something in for a yeast infection.

## 2024-05-01 NOTE — Telephone Encounter (Signed)
 Pt went to dermatologist in May. Was put on antibiotic and now has a yeast infection. Dermatologist not in office today. Will you send in med for yeast infection? Thanks! JSY

## 2024-05-01 NOTE — Telephone Encounter (Signed)
Rx sent in for diflucan.   

## 2024-09-17 ENCOUNTER — Telehealth: Admitting: Physician Assistant

## 2024-09-17 DIAGNOSIS — J019 Acute sinusitis, unspecified: Secondary | ICD-10-CM | POA: Diagnosis not present

## 2024-09-17 DIAGNOSIS — B9789 Other viral agents as the cause of diseases classified elsewhere: Secondary | ICD-10-CM | POA: Diagnosis not present

## 2024-09-17 DIAGNOSIS — R051 Acute cough: Secondary | ICD-10-CM | POA: Diagnosis not present

## 2024-09-17 MED ORDER — PSEUDOEPH-BROMPHEN-DM 30-2-10 MG/5ML PO SYRP: 5.0000 mL | ORAL_SOLUTION | Freq: Four times a day (QID) | ORAL | 0 refills | Status: DC | PRN

## 2024-09-17 MED ORDER — FLUTICASONE PROPIONATE 50 MCG/ACT NA SUSP: 2.0000 | Freq: Every day | NASAL | 0 refills | Status: AC

## 2024-09-17 NOTE — Patient Instructions (Signed)
 Sylvia Day, thank you for joining Delon [name], PA-C for today's virtual visit.  While this provider is not your primary care provider (PCP), if your PCP is located in our provider database this encounter information will be shared with them immediately following your visit.   A [facility] MyChart account gives you access to today's visit and all your visits, tests, and labs performed at [facility]  click here if you don't have a [facility] MyChart account or go to [email or web address]  Consent: (Patient) Sylvia Day provided verbal consent for this virtual visit at the beginning of the encounter.  Current Medications:  Current Outpatient Medications:    brompheniramine-pseudoephedrine-DM 30-2-10 MG/5ML syrup, Take 5 mLs by mouth 4 (four) times daily as needed., Disp: 120 mL, Rfl: 0   fluticasone (FLONASE) 50 MCG/ACT nasal spray, Place 2 sprays into both nostrils daily., Disp: 16 g, Rfl: 0   busPIRone  (BUSPAR ) 5 MG tablet, Take 1 tablet (5 mg total) by mouth 2 (two) times daily., Disp: 60 tablet, Rfl: 0   fluconazole  (DIFLUCAN ) 150 MG tablet, Take 1 now and 1 in 3 days if needed, Disp: 2 tablet, Rfl: 1   Medications ordered in this encounter:  Meds ordered this encounter  Medications   fluticasone (FLONASE) 50 MCG/ACT nasal spray    Sig: Place 2 sprays into both nostrils daily.    Dispense:  16 g    Refill:  0    Supervising Provider:   BLAISE ALEENE KIDD [[id number]]   brompheniramine-pseudoephedrine-DM 30-2-10 MG/5ML syrup    Sig: Take 5 mLs by mouth 4 (four) times daily as needed.    Dispense:  120 mL    Refill:  0    Supervising Provider:   BLAISE ALEENE KIDD [[id number]]     *If you need refills on other medications prior to your next appointment, please contact your pharmacy*  Follow-Up: Call back or seek an in-person evaluation if the symptoms worsen or if the condition fails to improve as anticipated.  [facility] [phone]  Other Instructions Viral Respiratory Infection A respiratory infection is an illness that affects part of the respiratory system, such as the lungs, nose, or throat. A respiratory infection that is caused by a virus is called a viral respiratory infection. Common types of viral respiratory infections include: A cold. The flu (influenza). A respiratory syncytial virus (RSV) infection. What are the causes? This condition is caused by a virus. The virus may spread through contact with droplets or direct contact with infected people or their mucus or secretions. The virus may spread from person to person (is contagious). What are the signs or symptoms? Symptoms of this condition include: A stuffy or runny nose. A sore throat or cough. Shortness of breath or difficulty breathing. Yellow or green mucus (sputum). Other symptoms may include: A fever. Sweating or chills. Fatigue. Achy muscles. A headache. How is this diagnosed? This condition may be diagnosed based on: Your symptoms. A physical exam. Testing of secretions from the nose or throat. Chest X-ray. How is this treated? This condition may be treated with medicines, such as: Antiviral medicine. This may shorten the length of time a person has symptoms. Expectorants. These make it easier to cough up mucus. Decongestant nasal sprays. Acetaminophen  or NSAIDs, such as ibuprofen , to relieve fever and pain. Antibiotic medicines are not prescribed for viral infections.This is because antibiotics are designed to kill bacteria. They do not kill viruses. Follow these instructions at home: Managing pain  and congestion Take over-the-counter and prescription medicines only as told by your health care provider. If you have a sore throat, gargle with a mixture of salt and water 3-4 times a day or as needed. To make salt water, completely dissolve -1 tsp (3-6 g) of salt in 1 cup (237 mL) of warm water. Use nose drops made from salt  water to ease congestion and soften raw skin around your nose. Take 2 tsp (10 mL) of honey at bedtime to lessen coughing at night. Do not give honey to children who are younger than 1 year. Drink enough fluid to keep your urine pale yellow. This helps prevent dehydration and helps loosen up mucus. General instructions  Rest as much as possible. Do not drink alcohol. Do not use any products that contain nicotine or tobacco. These products include cigarettes, chewing tobacco, and vaping devices, such as e-cigarettes. If you need help quitting, ask your health care provider. Keep all follow-up visits. This is important. How is this prevented?     Get an annual flu shot. You may get the flu shot in late summer, fall, or winter. Ask your health care provider when you should get your flu shot. Avoid spreading your infection to other people. If you are sick: Wash your hands with soap and water often, especially after you cough or sneeze. Wash for at least 20 seconds. If soap and water are not available, use alcohol-based hand sanitizer. Cover your mouth when you cough. Cover your nose and mouth when you sneeze. Do not share cups or eating utensils. Clean commonly used objects often. Clean commonly touched surfaces. Stay home from work or school as told by your health care provider. Avoid contact with people who are sick during cold and flu season. This is generally fall and winter. Contact a health care provider if: Your symptoms last for 10 days or longer. Your symptoms get worse over time. You have severe sinus pain in your face or forehead. The glands in your jaw or neck become very swollen. You have shortness of breath. Get help right away if you: Feel pain or pressure in your chest. Have trouble breathing. Faint or feel like you will faint. Have severe and persistent vomiting. Feel confused or disoriented. These symptoms may represent a serious problem that is an emergency. Do not  wait to see if the symptoms will go away. Get medical help right away. Call your local emergency services (911 in the [place] Do not drive yourself to the hospital. Summary A respiratory infection is an illness that affects part of the respiratory system, such as the lungs, nose, or throat. A respiratory infection that is caused by a virus is called a viral respiratory infection. Common types of viral respiratory infections include a cold, influenza, and respiratory syncytial virus (RSV) infection. Symptoms of this condition include a stuffy or runny nose, cough, fatigue, achy muscles, sore throat, and fevers or chills. Antibiotic medicines are not prescribed for viral infections. This is because antibiotics are designed to kill bacteria. They are not effective against viruses. This information is not intended to replace advice given to you by your health care provider. Make sure you discuss any questions you have with your health care provider. Document Revised: 02/16/2021 Document Reviewed: 02/16/2021 Elsevier Patient Education  2024 Elsevier Inc.   If you have been instructed to have an in-person evaluation today at a local Urgent Care facility, please use the link below. It will take you to a list of all  of our available [facility], including address, phone number and hours of operation. Please do not delay care.  [facility]  If you or a family member do not have a primary care provider, use the link below to schedule a visit and establish care. When you choose a [facility] primary care physician or advanced practice provider, you gain a long-term partner in health. Find a Primary Care Provider  Learn more about [facility]'s in-office and virtual care options:  - Get Care Now

## 2024-09-17 NOTE — Progress Notes (Signed)
 Virtual Visit Consent   Sylvia Day, you are scheduled for a virtual visit with a  provider today. Just as with appointments in the office, your consent must be obtained to participate. Your consent will be active for this visit and any virtual visit you may have with one of our providers in the next 365 days. If you have a MyChart account, a copy of this consent can be sent to you electronically.  As this is a virtual visit, video technology does not allow for your provider to perform a traditional examination. This may limit your provider's ability to fully assess your condition. If your provider identifies any concerns that need to be evaluated in person or the need to arrange testing (such as labs, EKG, etc.), we will make arrangements to do so. Although advances in technology are sophisticated, we cannot ensure that it will always work on either your end or our end. If the connection with a video visit is poor, the visit may have to be switched to a telephone visit. With either a video or telephone visit, we are not always able to ensure that we have a secure connection.  By engaging in this virtual visit, you consent to the provision of healthcare and authorize for your insurance to be billed (if applicable) for the services provided during this visit. Depending on your insurance coverage, you may receive a charge related to this service.  I need to obtain your verbal consent now. Are you willing to proceed with your visit today? Sylvia Day has provided verbal consent on 09/17/2024 for a virtual visit (video or telephone). Delon [name], PA-C  Date: 09/17/2024 3:25 PM   Virtual Visit via Video Note   IDelon [name], connected with  Sylvia Day  ([id number], [birth year]) on 09/17/24 at  3:15 PM EDT by a video-enabled telemedicine application and verified that I am speaking with the correct person using two identifiers.  Location: Patient: Virtual Visit  Location Patient: Home Provider: Virtual Visit Location Provider: Home Office   I discussed the limitations of evaluation and management by telemedicine and the availability of in person appointments. The patient expressed understanding and agreed to proceed.    History of Present Illness: Sylvia Day is a 38 y.o. who identifies as a female who was assigned female at birth, and is being seen today for URI symptoms.  HPI: Sinusitis This is a new problem. The current episode started in the past 7 days (Tuesday, 09/15/24). The problem has been gradually worsening since onset. There has been no fever. The pain is mild. Associated symptoms include chills, congestion, coughing, headaches, sinus pressure, sneezing and a sore throat (congestion in throat was intial symptom). Pertinent negatives include no diaphoresis, ear pain, neck pain or shortness of breath. (Rhinorrhea, fatigue) Treatments tried: lemon, honey hot water, vicks vaporub. The treatment provided no relief.  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   Pelvic pain 10/02/2022   Routine Papanicolaou smear 10/02/2022   Abnormal Pap smear of cervix 02/20/2021   Chlamydia 02/20/2021   Hair loss 07/01/2017   Irregular intermenstrual bleeding 07/01/2017   Vulvovaginitis due to yeast 10/25/2016    Allergies:  Allergies  Allergen Reactions   Latex Other (See Comments)    irritation   Percocet [Oxycodone -Acetaminophen ] Nausea And Vomiting    headache   Medications:  Current Outpatient Medications:  brompheniramine-pseudoephedrine-DM 30-2-10 MG/5ML syrup, Take 5 mLs by mouth 4 (four) times daily as needed., Disp: 120 mL, Rfl: 0   busPIRone  (BUSPAR ) 5 MG tablet, Take 1 tablet (5 mg total) by mouth 2 (two) times daily., Disp: 60 tablet, Rfl: 0   fluconazole  (DIFLUCAN ) 150 MG tablet, Take 1 now and 1 in 3 days if needed,  Disp: 2 tablet, Rfl: 1   fluticasone (FLONASE) 50 MCG/ACT nasal spray, Place 2 sprays into both nostrils daily., Disp: 16 g, Rfl: 0  Observations/Objective: Patient is well-developed, well-nourished in no acute distress.  Resting comfortably at home.  Head is normocephalic, atraumatic.  No labored breathing.  Speech is clear and coherent with logical content.  Patient is alert and oriented at baseline.    Assessment and Plan: 1. Acute viral sinusitis (Primary) - fluticasone (FLONASE) 50 MCG/ACT nasal spray; Place 2 sprays into both nostrils daily.  Dispense: 16 g; Refill: 0  2. Acute cough - brompheniramine-pseudoephedrine-DM 30-2-10 MG/5ML syrup; Take 5 mLs by mouth 4 (four) times daily as needed.  Dispense: 120 mL; Refill: 0  - Suspect viral URI - Symptomatic medications of choice over the counter as needed - Bromfed DM for cough - Flonase for congestion and drainage - Push fluids - Rest - Work note provided - Seek further evaluation if symptoms change or worsen   Follow Up Instructions: I discussed the assessment and treatment plan with the patient. The patient was provided an opportunity to ask questions and all were answered. The patient agreed with the plan and demonstrated an understanding of the instructions.  A copy of instructions were sent to the patient via MyChart unless otherwise noted below.    The patient was advised to call back or seek an in-person evaluation if the symptoms worsen or if the condition fails to improve as anticipated.    Delon [name], PA-C

## 2024-10-27 ENCOUNTER — Telehealth: Admitting: Physician Assistant

## 2024-10-27 DIAGNOSIS — R0981 Nasal congestion: Secondary | ICD-10-CM

## 2024-10-27 MED ORDER — CETIRIZINE-PSEUDOEPHEDRINE ER 5-120 MG PO TB12: 1.0000 | ORAL_TABLET | Freq: Two times a day (BID) | ORAL | 0 refills | Status: AC

## 2024-10-27 NOTE — Patient Instructions (Signed)
  Sylvia Day, thank you for joining Sylvia Velma Lunger, PA-C for today's virtual visit.  While this provider is not your primary care provider (PCP), if your PCP is located in our provider database this encounter information will be shared with them immediately following your visit.   A [facility] MyChart account gives you access to today's visit and all your visits, tests, and labs performed at [facility]  click here if you don't have a [facility] MyChart account or go to [email or web address]  Consent: (Patient) Sylvia Day provided verbal consent for this virtual visit at the beginning of the encounter.  Current Medications:  Current Outpatient Medications:    cetirizine -pseudoephedrine  (ZYRTEC -D) 5-120 MG tablet, Take 1 tablet by mouth 2 (two) times daily., Disp: 30 tablet, Rfl: 0   brompheniramine-pseudoephedrine -DM 30-2-10 MG/5ML syrup, Take 5 mLs by mouth 4 (four) times daily as needed., Disp: 120 mL, Rfl: 0   busPIRone  (BUSPAR ) 5 MG tablet, Take 1 tablet (5 mg total) by mouth 2 (two) times daily., Disp: 60 tablet, Rfl: 0   fluconazole  (DIFLUCAN ) 150 MG tablet, Take 1 now and 1 in 3 days if needed, Disp: 2 tablet, Rfl: 1   fluticasone  (FLONASE ) 50 MCG/ACT nasal spray, Place 2 sprays into both nostrils daily., Disp: 16 g, Rfl: 0   Medications ordered in this encounter:  Meds ordered this encounter  Medications   cetirizine -pseudoephedrine  (ZYRTEC -D) 5-120 MG tablet    Sig: Take 1 tablet by mouth 2 (two) times daily.    Dispense:  30 tablet    Refill:  0    Supervising Provider:   LAMPTEY, PHILIP O [[id number]]     *If you need refills on other medications prior to your next appointment, please contact your pharmacy*  Follow-Up: Call back or seek an in-person evaluation if the symptoms worsen or if the condition fails to improve as anticipated.  [facility] [phone]  Other Instructions Hydrate and rest. Start a saline nasal  rinse. Restart the Flonase . Stop plain Zyrtec  and start the Zyrtec -D as prescribed. If you have a humidifier, run it in the bedroom at night. If you note any non-resolving, new, or worsening symptoms despite treatment, please seek an in-person evaluation ASAP.    If you have been instructed to have an in-person evaluation today at a local Urgent Care facility, please use the link below. It will take you to a list of all of our available [facility], including address, phone number and hours of operation. Please do not delay care.  [facility]  If you or a family member do not have a primary care provider, use the link below to schedule a visit and establish care. When you choose a [facility] primary care physician or advanced practice provider, you gain a long-term partner in health. Find a Primary Care Provider  Learn more about [facility]'s in-office and virtual care options: [facility] - Get Care Now

## 2024-10-27 NOTE — Progress Notes (Signed)
 Virtual Visit Consent   Sylvia Day, you are scheduled for a virtual visit with a [facility] provider today. Just as with appointments in the office, your consent must be obtained to participate. Your consent will be active for this visit and any virtual visit you may have with one of our providers in the next 365 days. If you have a MyChart account, a copy of this consent can be sent to you electronically.  As this is a virtual visit, video technology does not allow for your provider to perform a traditional examination. This may limit your provider's ability to fully assess your condition. If your provider identifies any concerns that need to be evaluated in person or the need to arrange testing (such as labs, EKG, etc.), we will make arrangements to do so. Although advances in technology are sophisticated, we cannot ensure that it will always work on either your end or our end. If the connection with a video visit is poor, the visit may have to be switched to a telephone visit. With either a video or telephone visit, we are not always able to ensure that we have a secure connection.  By engaging in this virtual visit, you consent to the provision of healthcare and authorize for your insurance to be billed (if applicable) for the services provided during this visit. Depending on your insurance coverage, you may receive a charge related to this service.  I need to obtain your verbal consent now. Are you willing to proceed with your visit today? Kalima Kerekes has provided verbal consent on 10/27/2024 for a virtual visit (video or telephone). Sylvia Day, [place]  Date: 10/27/2024 6:04 PM   Virtual Visit via Video Note   I, Sylvia Day, connected with  Allee Busk  ([id number], [birth year]) on 10/27/24 at  6:00 PM EST by a video-enabled telemedicine application and verified that I am speaking with the correct person using two identifiers.  Location: Patient: Virtual Visit  Location Patient: Home Provider: Virtual Visit Location Provider: Home Office   I discussed the limitations of evaluation and management by telemedicine and the availability of in person appointments. The patient expressed understanding and agreed to proceed.    History of Present Illness: Sylvia Day is a 38 y.o. who identifies as a female who was assigned female at birth, and is being seen today for for 2.5 days of nasal congestion, rhinorrhea, sinus pressure and post-nasal drip. Denies sinus pain, fever, chills, chest congestion or any substantial cough. Denies recent travel or sick contact. Took a home COVID test -- negative.  OTC -- Zyrtec    HPI: HPI  Problems:  Patient Active Problem List   Diagnosis Date Noted   LGSIL of cervix of undetermined significance 10/08/2022   Pelvic pain 10/02/2022   Routine Papanicolaou smear 10/02/2022   Abnormal Pap smear of cervix 02/20/2021   Chlamydia 02/20/2021   Hair loss 07/01/2017   Irregular intermenstrual bleeding 07/01/2017   Vulvovaginitis due to yeast 10/25/2016    Allergies:  Allergies  Allergen Reactions   Latex Other (See Comments)    irritation   Percocet [Oxycodone -Acetaminophen ] Nausea And Vomiting    headache   Medications:  Current Outpatient Medications:    cetirizine -pseudoephedrine (ZYRTEC -D) 5-120 MG tablet, Take 1 tablet by mouth 2 (two) times daily., Disp: 30 tablet, Rfl: 0   busPIRone  (BUSPAR ) 5 MG tablet, Take 1 tablet (5 mg total) by mouth 2 (two) times daily., Disp: 60 tablet, Rfl: 0   fluconazole  (DIFLUCAN ) 150 MG  tablet, Take 1 now and 1 in 3 days if needed, Disp: 2 tablet, Rfl: 1   fluticasone  (FLONASE ) 50 MCG/ACT nasal spray, Place 2 sprays into both nostrils daily., Disp: 16 g, Rfl: 0  Observations/Objective: Patient is well-developed, well-nourished in no acute distress.  Resting comfortably  at home.  Head is normocephalic, atraumatic.  No labored breathing.  Speech is clear and coherent with  logical content.  Patient is alert and oriented at baseline.   Assessment and Plan: 1. Nasal congestion (Primary) - cetirizine -pseudoephedrine (ZYRTEC -D) 5-120 MG tablet; Take 1 tablet by mouth 2 (two) times daily.  Dispense: 30 tablet; Refill: 0  Suspect allergic inflammation versus viral etiology. No alarm signs or symptoms present. Seems to happen with weather changes for her so again question if allergic component. Increase fluids and rest. Start saline nasal rinse. Restart Flonase . Will switch Zyrtec  for Zyrtec -D. Rx provided. Work note provided. Follow-up for any non-resolving, new or worsening symptoms despite treatment.  Follow Up Instructions: I discussed the assessment and treatment plan with the patient. The patient was provided an opportunity to ask questions and all were answered. The patient agreed with the plan and demonstrated an understanding of the instructions.  A copy of instructions were sent to the patient via MyChart unless otherwise noted below.   The patient was advised to call back or seek an in-person evaluation if the symptoms worsen or if the condition fails to improve as anticipated.    Sylvia Velma Lunger, PA-C

## 2024-12-04 ENCOUNTER — Telehealth: Payer: Self-pay | Admitting: Physician Assistant

## 2024-12-04 DIAGNOSIS — T3695XA Adverse effect of unspecified systemic antibiotic, initial encounter: Secondary | ICD-10-CM | POA: Diagnosis not present

## 2024-12-04 DIAGNOSIS — B379 Candidiasis, unspecified: Secondary | ICD-10-CM

## 2024-12-04 DIAGNOSIS — B9689 Other specified bacterial agents as the cause of diseases classified elsewhere: Secondary | ICD-10-CM

## 2024-12-04 DIAGNOSIS — N760 Acute vaginitis: Secondary | ICD-10-CM | POA: Diagnosis not present

## 2024-12-04 MED ORDER — FLUCONAZOLE 150 MG PO TABS: 150.0000 mg | ORAL_TABLET | ORAL | 0 refills | Status: AC | PRN

## 2024-12-04 MED ORDER — METRONIDAZOLE 0.75 % VA GEL: 1.0000 | Freq: Every day | VAGINAL | 0 refills | Status: AC

## 2024-12-04 NOTE — Patient Instructions (Signed)
 " Sylvia Day, thank you for joining Delon Sylvia Day Dickinson, PA-C for today's virtual visit.  While this provider is not your primary care provider (PCP), if your PCP is located in our provider database this encounter information will be shared with them immediately following your visit.   A [facility] MyChart account gives you access to today's visit and all your visits, tests, and labs performed at [facility]  click here if you don't have a [facility] MyChart account or go to [email or web address]  Consent: (Patient) Sylvia Day provided verbal consent for this virtual visit at the beginning of the encounter.  Current Medications:  Current Outpatient Medications:    fluconazole  (DIFLUCAN ) 150 MG tablet, Take 1 tablet (150 mg total) by mouth every 3 (three) days as needed., Disp: 2 tablet, Rfl: 0   metroNIDAZOLE  (METROGEL ) 0.75 % vaginal gel, Place 1 Applicatorful vaginally at bedtime for 7 days., Disp: 70 g, Rfl: 0   busPIRone  (BUSPAR ) 5 MG tablet, Take 1 tablet (5 mg total) by mouth 2 (two) times daily., Disp: 60 tablet, Rfl: 0   cetirizine -pseudoephedrine  (ZYRTEC -D) 5-120 MG tablet, Take 1 tablet by mouth 2 (two) times daily., Disp: 30 tablet, Rfl: 0   fluticasone  (FLONASE ) 50 MCG/ACT nasal spray, Place 2 sprays into both nostrils daily., Disp: 16 g, Rfl: 0   Medications ordered in this encounter:  Meds ordered this encounter  Medications   metroNIDAZOLE  (METROGEL ) 0.75 % vaginal gel    Sig: Place 1 Applicatorful vaginally at bedtime for 7 days.    Dispense:  70 g    Refill:  0    Supervising Provider:   BLAISE ALEENE KIDD [id number]   fluconazole  (DIFLUCAN ) 150 MG tablet    Sig: Take 1 tablet (150 mg total) by mouth every 3 (three) days as needed.    Dispense:  2 tablet    Refill:  0    Supervising Provider:   LAMPTEY, PHILIP O [[id number]]     *If you need refills on other medications prior to your next appointment, please contact your  pharmacy*  Follow-Up: Call back or seek an in-person evaluation if the symptoms worsen or if the condition fails to improve as anticipated.  [facility] [phone]  Other Instructions Vaginal Probiotics: AZO vaginal probiotic OLLY Happy Hoo-Ha RAW Vaginal Care RenewLife Women's vaginal probiotic RepHresh Pro-B  Vaginal washes: Honey Pot Summer's Eve Vagisil Feminine cleanser  Boric Acid Suppositories  Healthy vaginal hygiene practices    -  Avoid sleeper pajamas. Nightgowns allow air to circulate.  Sleep without underpants whenever possible.   -  Wear cotton underpants during the day. Double-rinse underwear after washing to avoid residual irritants. Do not use fabric softeners for underwear and swimsuits.   - Avoid tights, leotards, leggings, skinny jeans, and other tight-fitting clothing. Skirts and loose-fitting pants allow air to circulate.   - Avoid pantyliners.  Instead use tampons or cotton pads.   - Use the restroom after intercourse to help prevent UTI's   - Daily warm bathing is helpful:     - Soak in clean water (no soap) for 10 to 15 minutes. Adding vinegar or baking soda to the water has not been specifically studied and may not be better than clean water alone.      - Use soap to wash regions other than the genital area just before getting out of the tub. Limit use of any soap on genital areas. Use fragance-free soaps.     - Rinse  the genital area well and gently pat dry.  Don't rub.  Hair dryer to assist with drying can be used only if on cool setting.     - Do not use bubble baths or perfumed soaps.   - Do not use any feminine sprays, douches or powders.  These contain chemicals that will irritate the skin.   - If the genital area is tender or swollen, cool compresses may relieve the discomfort. Unscented wet wipes can be used instead of toilet paper for wiping.    - Emollients, such as Vaseline, may help protect skin and can be applied to  the irritated area.   - Always remember to wipe front-to-back after bowel movements. Pat dry after urination.   - Do not sit in wet swimsuits for long periods of time after swimming    If you have been instructed to have an in-person evaluation today at a local Urgent Care facility, please use the link below. It will take you to a list of all of our available [facility], including address, phone number and hours of operation. Please do not delay care.  [facility]  If you or a family member do not have a primary care provider, use the link below to schedule a visit and establish care. When you choose a [facility] primary care physician or advanced practice provider, you gain a long-term partner in health. Find a Primary Care Provider  Learn more about [facility]'s in-office and virtual care options: [facility] - Get Care Now "

## 2024-12-04 NOTE — Progress Notes (Signed)
 " Virtual Visit Consent   Sylvia Day, you are scheduled for a virtual visit with a [facility] provider today. Just as with appointments in the office, your consent must be obtained to participate. Your consent will be active for this visit and any virtual visit you may have with one of our providers in the next 365 days. If you have a MyChart account, a copy of this consent can be sent to you electronically.  As this is a virtual visit, video technology does not allow for your provider to perform a traditional examination. This may limit your provider's ability to fully assess your condition. If your provider identifies any concerns that need to be evaluated in person or the need to arrange testing (such as labs, EKG, etc.), we will make arrangements to do so. Although advances in technology are sophisticated, we cannot ensure that it will always work on either your end or our end. If the connection with a video visit is poor, the visit may have to be switched to a telephone visit. With either a video or telephone visit, we are not always able to ensure that we have a secure connection.  By engaging in this virtual visit, you consent to the provision of healthcare and authorize for your insurance to be billed (if applicable) for the services provided during this visit. Depending on your insurance coverage, you may receive a charge related to this service.  I need to obtain your verbal consent now. Are you willing to proceed with your visit today? Sylvia Day has provided verbal consent on 12/04/2024 for a virtual visit (video or telephone). Delon [name], PA-C  Date: 12/04/2024 5:10 PM   Virtual Visit via Video Note   I, Delon [name], connected with  Sylvia Day  ([id number], [birth year]) on 12/04/2024 at  5:00 PM EST by a video-enabled telemedicine application and verified that I am speaking with the correct person using two identifiers.  Location: Patient: Virtual Visit  Location Patient: Mobile Provider: Virtual Visit Location Provider: Home Office   I discussed the limitations of evaluation and management by telemedicine and the availability of in person appointments. The patient expressed understanding and agreed to proceed.    History of Present Illness: Sylvia Day is a 39 y.o. who identifies as a female who was assigned female at birth, and is being seen today for vaginal discharge.  HPI: Vaginal Discharge The patient's primary symptoms include a genital odor and vaginal discharge. This is a new problem. The current episode started in the past 7 days (Used a scented oil that threw off pH). The problem occurs constantly. The problem has been unchanged. The patient is experiencing no pain. She is not pregnant. Pertinent negatives include no abdominal pain, back pain, discolored urine, dysuria, fever, frequency, nausea or painful intercourse. The vaginal discharge was white, milky and malodorous. There has been no bleeding. She has not been passing clots. She has not been passing tissue. Nothing aggravates the symptoms. Treatments tried: Boric acid suppositories, vaginal probiotics. The treatment provided no relief.     Problems:  Patient Active Problem List   Diagnosis Date Noted   LGSIL of cervix of undetermined significance 10/08/2022   Pelvic pain 10/02/2022   Routine Papanicolaou smear 10/02/2022   Abnormal Pap smear of cervix 02/20/2021   Chlamydia 02/20/2021   Hair loss 07/01/2017   Irregular intermenstrual bleeding 07/01/2017   Vulvovaginitis due to yeast 10/25/2016    Allergies: Allergies[1] Medications: Current Medications[2]  Observations/Objective: Patient is well-developed,  well-nourished in no acute distress.  Resting comfortably  Head is normocephalic, atraumatic.  No labored breathing.  Speech is clear and coherent with logical content.  Patient is alert and oriented at baseline.    Assessment and Plan: 1. BV (bacterial  vaginosis) (Primary) - metroNIDAZOLE  (METROGEL ) 0.75 % vaginal gel; Place 1 Applicatorful vaginally at bedtime for 7 days.  Dispense: 70 g; Refill: 0  2. Antibiotic-induced yeast infection - fluconazole  (DIFLUCAN ) 150 MG tablet; Take 1 tablet (150 mg total) by mouth every 3 (three) days as needed.  Dispense: 2 tablet; Refill: 0  - Symptoms consistent with BV - Metrogel  prescribed - Limit bubble baths, scented lotions/soaps/detergents - Limit tight fitting clothing - Diflucan  given as prophylaxis as patient tends to get vaginal yeast infections with antibiotic use - Seek on person evaluation if not improving or if symptoms worsen   Follow Up Instructions: I discussed the assessment and treatment plan with the patient. The patient was provided an opportunity to ask questions and all were answered. The patient agreed with the plan and demonstrated an understanding of the instructions.  A copy of instructions were sent to the patient via MyChart unless otherwise noted below.    The patient was advised to call back or seek an in-person evaluation if the symptoms worsen or if the condition fails to improve as anticipated.    Delon [name], PA-C     [1]  Allergies Allergen Reactions   Latex Other (See Comments)    irritation   Percocet [Oxycodone -Acetaminophen ] Nausea And Vomiting    headache  [2]  Current Outpatient Medications:    fluconazole  (DIFLUCAN ) 150 MG tablet, Take 1 tablet (150 mg total) by mouth every 3 (three) days as needed., Disp: 2 tablet, Rfl: 0   metroNIDAZOLE  (METROGEL ) 0.75 % vaginal gel, Place 1 Applicatorful vaginally at bedtime for 7 days., Disp: 70 g, Rfl: 0   busPIRone  (BUSPAR ) 5 MG tablet, Take 1 tablet (5 mg total) by mouth 2 (two) times daily., Disp: 60 tablet, Rfl: 0   cetirizine -pseudoephedrine  (ZYRTEC -D) 5-120 MG tablet, Take 1 tablet by mouth 2 (two) times daily., Disp: 30 tablet, Rfl: 0   fluticasone  (FLONASE ) 50 MCG/ACT nasal spray, Place 2  sprays into both nostrils daily., Disp: 16 g, Rfl: 0  "

## 2025-01-15 ENCOUNTER — Ambulatory Visit: Admitting: Physician Assistant
